# Patient Record
Sex: Female | Born: 1995 | Race: Black or African American | Hispanic: No | Marital: Single | Smoking: Never smoker
Health system: Southern US, Community
[De-identification: ages and names within clinical notes are randomized; demographics above are authoritative.]

## PROBLEM LIST (undated history)

## (undated) DIAGNOSIS — I471 Supraventricular tachycardia: Secondary | ICD-10-CM

## (undated) DIAGNOSIS — I10 Essential (primary) hypertension: Secondary | ICD-10-CM

## (undated) HISTORY — DX: Supraventricular tachycardia: I47.1

## (undated) HISTORY — PX: NO PAST SURGERIES: SHX2092

---

## 2019-08-21 ENCOUNTER — Other Ambulatory Visit: Payer: Self-pay

## 2019-08-21 ENCOUNTER — Emergency Department (HOSPITAL_COMMUNITY): Payer: 59

## 2019-08-21 ENCOUNTER — Ambulatory Visit (HOSPITAL_COMMUNITY)
Admission: EM | Admit: 2019-08-21 | Discharge: 2019-08-21 | Disposition: A | Discharge status: Home / Self Care | Payer: 59

## 2019-08-21 ENCOUNTER — Emergency Department (HOSPITAL_COMMUNITY)
Admission: EM | Admit: 2019-08-21 | Discharge: 2019-08-21 | Disposition: A | Discharge status: Home / Self Care | Payer: 59 | Attending: Emergency Medicine | Admitting: Emergency Medicine

## 2019-08-21 ENCOUNTER — Encounter (HOSPITAL_COMMUNITY): Payer: Self-pay | Admitting: Emergency Medicine

## 2019-08-21 ENCOUNTER — Encounter (HOSPITAL_COMMUNITY): Payer: Self-pay

## 2019-08-21 DIAGNOSIS — R0789 Other chest pain: Secondary | ICD-10-CM | POA: Insufficient documentation

## 2019-08-21 DIAGNOSIS — I10 Essential (primary) hypertension: Secondary | ICD-10-CM | POA: Diagnosis not present

## 2019-08-21 DIAGNOSIS — Z793 Long term (current) use of hormonal contraceptives: Secondary | ICD-10-CM | POA: Diagnosis not present

## 2019-08-21 DIAGNOSIS — R002 Palpitations: Secondary | ICD-10-CM | POA: Diagnosis not present

## 2019-08-21 DIAGNOSIS — R000 Tachycardia, unspecified: Secondary | ICD-10-CM | POA: Insufficient documentation

## 2019-08-21 DIAGNOSIS — Z789 Other specified health status: Secondary | ICD-10-CM

## 2019-08-21 HISTORY — DX: Essential (primary) hypertension: I10

## 2019-08-21 LAB — BASIC METABOLIC PANEL
Anion gap: 14 (ref 5–15)
BUN: 7 mg/dL (ref 6–20)
CO2: 21 mmol/L — ABNORMAL LOW (ref 22–32)
Calcium: 9.6 mg/dL (ref 8.9–10.3)
Chloride: 101 mmol/L (ref 98–111)
Creatinine, Ser: 0.85 mg/dL (ref 0.44–1.00)
GFR calc Af Amer: >60 mL/min (ref >60)
GFR calc non Af Amer: >60 mL/min (ref >60)
Glucose, Bld: 88 mg/dL (ref 70–99)
Potassium: 3.7 mmol/L (ref 3.5–5.1)
Sodium: 136 mmol/L (ref 135–145)

## 2019-08-21 LAB — URINALYSIS, ROUTINE W REFLEX MICROSCOPIC
Bilirubin Urine: NEGATIVE
Glucose, UA: NEGATIVE mg/dL
Hgb urine dipstick: NEGATIVE
Ketones, ur: 5 mg/dL — AB
Leukocytes,Ua: NEGATIVE
Nitrite: NEGATIVE
Protein, ur: NEGATIVE mg/dL
Specific Gravity, Urine: 1.016 (ref 1.005–1.030)
pH: 6 (ref 5.0–8.0)

## 2019-08-21 LAB — RAPID URINE DRUG SCREEN, HOSP PERFORMED
Amphetamines: NOT DETECTED
Barbiturates: NOT DETECTED
Benzodiazepines: NOT DETECTED
Cocaine: NOT DETECTED
Opiates: NOT DETECTED
Tetrahydrocannabinol: NOT DETECTED

## 2019-08-21 LAB — I-STAT BETA HCG BLOOD, ED (MC, WL, AP ONLY): I-stat hCG, quantitative: <5 m[IU]/mL (ref <5)

## 2019-08-21 LAB — CBC
HCT: 45 % (ref 36.0–46.0)
Hemoglobin: 15.1 g/dL — ABNORMAL HIGH (ref 12.0–15.0)
MCH: 32.9 pg (ref 26.0–34.0)
MCHC: 33.6 g/dL (ref 30.0–36.0)
MCV: 98 fL (ref 80.0–100.0)
Platelets: 248 10*3/uL (ref 150–400)
RBC: 4.59 MIL/uL (ref 3.87–5.11)
RDW: 13.4 % (ref 11.5–15.5)
WBC: 6.9 10*3/uL (ref 4.0–10.5)
nRBC: 0 % (ref 0.0–0.2)

## 2019-08-21 LAB — TROPONIN I (HIGH SENSITIVITY)
Troponin I (High Sensitivity): 5 ng/L (ref <18)
Troponin I (High Sensitivity): 6 ng/L (ref <18)

## 2019-08-21 LAB — D-DIMER, QUANTITATIVE: D-Dimer, Quant: <0.27 ug/mL-FEU (ref 0.00–0.50)

## 2019-08-21 LAB — TSH: TSH: 2.013 u[IU]/mL (ref 0.350–4.500)

## 2019-08-21 MED ORDER — METOPROLOL TARTRATE 5 MG/5ML IV SOLN
5.0000 mg | Freq: Once | INTRAVENOUS | Status: AC
  Administered 2019-08-21: 5 mg via INTRAVENOUS
  Filled 2019-08-21: qty 5

## 2019-08-21 MED ORDER — ADENOSINE 6 MG/2ML IV SOLN
12.0000 mg | Freq: Once | INTRAVENOUS | Status: AC
  Administered 2019-08-21: 16:00:00 12 mg via INTRAVENOUS

## 2019-08-21 MED ORDER — LORAZEPAM 2 MG/ML IJ SOLN
0.5000 mg | Freq: Once | INTRAMUSCULAR | Status: AC
  Administered 2019-08-21: 13:00:00 0.5 mg via INTRAVENOUS
  Filled 2019-08-21: qty 1

## 2019-08-21 MED ORDER — SODIUM CHLORIDE 0.9 % IV BOLUS
1000.0000 mL | Freq: Once | INTRAVENOUS | Status: AC
  Administered 2019-08-21: 1000 mL via INTRAVENOUS

## 2019-08-21 MED ORDER — ADENOSINE 6 MG/2ML IV SOLN
6.0000 mg | Freq: Once | INTRAVENOUS | Status: AC
  Administered 2019-08-21: 6 mg via INTRAVENOUS
  Filled 2019-08-21: qty 2

## 2019-08-21 MED ORDER — METOPROLOL TARTRATE 25 MG PO TABS: 25.0000 mg | ORAL_TABLET | Freq: Two times a day (BID) | ORAL | 0 refills | Status: DC

## 2019-08-21 MED ORDER — SODIUM CHLORIDE 0.9% FLUSH: 3.0000 mL | Freq: Once | INTRAVENOUS | Status: DC

## 2019-08-21 NOTE — Discharge Instructions (Signed)
Please go to the emergency department for further evaluation and management of your heart rate and blood pressure.

## 2019-08-21 NOTE — ED Notes (Signed)
Patient is being discharged from the [facility] and sent to the Emergency Department via wheelchair by staff. Per Malachy Moan, NP, patient is stable but in need of higher level of care due to tachycardia, chest pain, and sob. EKG obtained during visit, sinus tach. Patient is aware and verbalizes understanding of plan of care.  Vitals:   08/21/19 0828  BP: (!) 176/109  Pulse: (!) 146  Resp: 16  Temp: 98.7 F (37.1 C)  SpO2: 100%

## 2019-08-21 NOTE — ED Notes (Signed)
Patient verbalizes understanding of discharge instructions. Opportunity for questioning and answers were provided. Armband removed by staff, pt discharged from ED.  

## 2019-08-21 NOTE — ED Triage Notes (Addendum)
Pt reports feeling like her heart is pounding and initially having some chest tightness that began about 1 week ago. States that chest is no longer tight but that she had bilateral arm tingling today, went to UC and was sent here for further evaluation, r/o PE. Pt a/ox4, resp e/u, nad. Denies sob.

## 2019-08-21 NOTE — Discharge Instructions (Signed)
Start metoprolol once daily for blood pressure control and to help with high heart rate Make an appointment with Cardiology to follow up Please return if you are worsening

## 2019-08-21 NOTE — ED Triage Notes (Signed)
Pt states having chest dull chest pain x 1 day. Pt reports having headache and pounding heart rate x 1 week. Pt reports she woke up this morning with tingling sensation in her neck and irradiated to her arms.

## 2019-08-21 NOTE — ED Provider Notes (Signed)
[facility] EMERGENCY DEPARTMENT Provider Note   CSN: [id number] Arrival date & time: 08/21/19  0908     History   Chief Complaint Chief Complaint  Patient presents with  . Chest Pain  . Palpitations    HPI Leslie Tucker is a 23 y.o. female who presents with palpitations. She states that for the past weeks she has had "episodes" of where she will feel like her heart is racing, she will have a headache, sweats, and her chest will feel tight. It will usually last about an hour and then go away. Today she woke up and had another episode and then felt like her arms were tingling. This prompted her to go to UC. Her HR was found to be in the 130s. She was then referred to the ED for unexplained tachycardia. She reports associated headache and ongoing palpitations now. She reports frequent alcohol use - about 3 times a week she will have 3-4 glasses of wine. She also has been taking some generic OTC energy pills - her last dose of this was on Tuesday but otherwise she denies any prescription medication or other OTC meds. No recent surgery/travel/immobilization, hx of cancer, leg swelling, hemoptysis, prior DVT/PE. She is on OCPs.     HPI  Past Medical History:  Diagnosis Date  . Hypertension     There are no active problems to display for this patient.   History reviewed. No pertinent surgical history.   OB History    Gravida  1   Para  0   Term      Preterm      AB  1   Living  0     SAB      TAB      Ectopic      Multiple      Live Births               Home Medications    Prior to Admission medications   Medication Sig Start Date End Date Taking? Authorizing Provider  Levonorgestrel-Ethinyl Estradiol (AMETHIA) 0.15-0.03 &0.01 MG tablet Take 1 tablet by mouth daily. 08/03/19   [provider]    Family History No family history on file.  Social History Social History   Tobacco Use  . Smoking status: Never Smoker   . Smokeless tobacco: Never Used  Substance Use Topics  . Alcohol use: Yes  . Drug use: Never     Allergies   Patient has no known allergies.   Review of Systems Review of Systems  Constitutional: Negative for fever.  Respiratory: Positive for chest tightness. Negative for shortness of breath.   Cardiovascular: Positive for palpitations. Negative for chest pain and leg swelling.  Neurological: Positive for headaches. Negative for syncope and light-headedness.  All other systems reviewed and are negative.    Physical Exam Updated Vital Signs BP (!) 178/110 (BP Location: Right Arm)   Pulse (!) 130   Temp 98.3 F (36.8 C) (Oral)   Resp 16   SpO2 100%   Physical Exam Vitals signs and nursing note reviewed.  Constitutional:      General: She is not in acute distress.    Appearance: Normal appearance. She is well-developed. She is not ill-appearing.     Comments: Calm and cooperative. NAD  HENT:     Head: Normocephalic and atraumatic.  Eyes:     General: No scleral icterus.       Right eye: No discharge.  Left eye: No discharge.     Conjunctiva/sclera: Conjunctivae normal.     Pupils: Pupils are equal, round, and reactive to light.  Neck:     Musculoskeletal: Normal range of motion.  Cardiovascular:     Rate and Rhythm: Regular rhythm. Tachycardia present.  Pulmonary:     Effort: Pulmonary effort is normal. No respiratory distress.     Breath sounds: Normal breath sounds.  Abdominal:     General: There is no distension.     Palpations: Abdomen is soft.     Tenderness: There is no abdominal tenderness.  Musculoskeletal:     Right lower leg: No edema.     Left lower leg: No edema.  Skin:    General: Skin is warm and dry.  Neurological:     Mental Status: She is alert and oriented to person, place, and time.  Psychiatric:        Behavior: Behavior normal.      ED Treatments / Results  Labs (all labs ordered are listed, but only abnormal results are  displayed) Labs Reviewed  BASIC METABOLIC PANEL - Abnormal; Notable for the following components:      Result Value   CO2 21 (*)    All other components within normal limits  CBC - Abnormal; Notable for the following components:   Hemoglobin 15.1 (*)    All other components within normal limits  URINALYSIS, ROUTINE W REFLEX MICROSCOPIC - Abnormal; Notable for the following components:   Ketones, ur 5 (*)    All other components within normal limits  TSH  RAPID URINE DRUG SCREEN, HOSP PERFORMED  D-DIMER, QUANTITATIVE (NOT AT [facility])  I-STAT BETA HCG BLOOD, ED ([facility], [facility], [facility] ONLY)  TROPONIN I (HIGH SENSITIVITY)  TROPONIN I (HIGH SENSITIVITY)    EKG EKG Interpretation  Date/Time:  Friday August 21 2019 11:31:59 EDT Ventricular Rate:  137 PR Interval:    QRS Duration: 83 QT Interval:  287 QTC Calculation: 434 R Axis:   48 Text Interpretation:  Sinus tachycardia Low voltage, precordial leads Confirmed by Davonna Belling [phone]) on 08/21/2019 11:38:18 AM   Radiology Dg Chest 2 View  Result Date: 08/21/2019 CLINICAL DATA:  Dull chest pain, palpitations, headache EXAM: CHEST - 2 VIEW COMPARISON:  None. FINDINGS: The heart size and mediastinal contours are within normal limits. Both lungs are clear. The visualized skeletal structures are unremarkable. IMPRESSION: No active cardiopulmonary disease. Electronically Signed   By: Rolm Baptise M.D.   On: 08/21/2019 09:38    Procedures Procedures (including critical care time)  Medications Ordered in ED Medications  sodium chloride 0.9 % bolus 1,000 mL (0 mLs Intravenous Stopped 08/21/19 1455)  LORazepam (ATIVAN) injection 0.5 mg (0.5 mg Intravenous Given 08/21/19 1240)  adenosine (ADENOCARD) 6 MG/2ML injection 6 mg (6 mg Intravenous Given 08/21/19 1540)  adenosine (ADENOCARD) 6 MG/2ML injection 12 mg (12 mg Intravenous Given 08/21/19 1547)  metoprolol tartrate (LOPRESSOR) injection 5 mg (5 mg Intravenous Given 08/21/19 1556)      Initial Impression / Assessment and Plan / ED Course  I have reviewed the triage vital signs and the nursing notes.  Pertinent labs & imaging results that were available during my care of the patient were reviewed by me and considered in my medical decision making (see chart for details).  23 year old female presents with palpitations and unexplained tachycardia.  She has also significantly hypertensive.  She states that she has been told her blood pressure has been high before but has never been  diagnosed or treated for this before.  Unclear etiology at this time.  Could be related to anxiety, PE, hypovolemia/dehydration, SVT, hyperthyroidism, infection, drug use (although patient denies).  She also has significant alcohol use and does admit to drinking last night which could be related.  EKG is sinus tachycardia with rate in the 130s.  Will obtain blood work, chest x-ray, TSH, D-dimer  CBC is remarkable for mild elevation of hemoglobin.  BMP is remarkable for mildly low bicarb.  First and second troponin is 5 and 6 respectively.  She is not pregnant.  Her TSH within normal limits.  Her D-dimer is undetectable.  Her urine has 5 ketones so she may be very mildly dehydrated.  We will try fluids and Ativan.  2:41 PM Rechecked pt. She is still having HR in the 140s-150s. Will try adenosine.  6mg  Adenosine was attempted with no significant change and then 12mg  was given again without any change. Will try metoprolol.  After Metoprolol heart rate has decreased significantly to the high 90s.  Will start on a low-dose of this medicine and have her follow-up with cardiology.  Side effects of this medicine were discussed.  Patient is comfortable with plan.  She was given return precautions.  Final Clinical Impressions(s) / ED Diagnoses   Final diagnoses:  Tachycardia  Hypertension, unspecified type  Palpitations    ED Discharge Orders    None       Bethel BornGekas, Oyinkansola Truax Marie, PA-C 08/22/19 [id number]     Benjiman CorePickering, Nathan, MD 08/25/19 2242

## 2019-08-21 NOTE — ED Provider Notes (Signed)
[facility]    CSN: [id number] Arrival date & time: 08/21/19  3664      History   Chief Complaint Chief Complaint  Patient presents with  . Chest Pain  . Tingling  . Tachycardia  . Headache    HPI Leslie Tucker is a 23 y.o. female.   [facility] presents with complaints of "heart pounding" chest tightness, and headache. Has been ongoing for approximately 1 week now but much worse this morning with sensation of "numbness or tingling" to neck, arms and hands. Sometimes feels tingling down her legs. No shortness of breath. Pain pain with breathing or other described chest pain. States she can be laying down at rest and feel her heart rate going quickly. No cough. 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 She is on oral birth control. Doesn't smoke. She states she has chronic nausea, this is not new for her. No specific diaphoresis. Family history of hypertension, and states her grandfather recently had an ICD placed. States she has been told she is hypertensive for years with routine doctor's visits, typically in the 140's. Has never been on BP medications. Doesn't follow regularly with a PCP. No leg pain or swelling. No vision changes. No dizziness. No head injury. No confusion or slurred speech. Ambulatory without difficulty. Doesn't have regular periods due to her oral birth control, had spotting earlier this month.    ROS per HPI, negative if not otherwise mentioned.      Past Medical History:  Diagnosis Date  . Hypertension     There are no active problems to display for this patient.   History reviewed. No pertinent surgical history.  OB History    Gravida  1   Para  0   Term      Preterm      AB  1   Living  0     SAB      TAB      Ectopic      Multiple      Live Births               Home Medications    Prior to Admission  medications   Medication Sig Start Date End Date Taking? Authorizing Provider  Levonorgestrel-Ethinyl Estradiol (AMETHIA) 0.15-0.03 &0.01 MG tablet Take 1 tablet by mouth daily. 08/03/19   [provider]    Family History History reviewed. No pertinent family history.  Social History Social History   Tobacco Use  . Smoking status: Never Smoker  . Smokeless tobacco: Never Used  Substance Use Topics  . Alcohol use: Yes  . Drug use: Never     Allergies   Patient has no known allergies.   Review of Systems Review of Systems   Physical Exam Triage Vital Signs ED Triage Vitals  Enc Vitals Group     BP 08/21/19 0828 (!) 176/109     Pulse Rate 08/21/19 0828 (!) 146     Resp 08/21/19 0828 16     Temp 08/21/19 0828 98.7 F (37.1 C)     Temp src --      SpO2 08/21/19 0828 100 %     Weight --      Height --      Head Circumference --      Peak Flow --      Pain Score 08/21/19 0822 3  Pain Loc --      Pain Edu? --      Excl. in GC? --    No data found.  Updated Vital Signs BP (!) 176/109 (BP Location: Right Arm)   Pulse (!) 146   Temp 98.7 F (37.1 C)   Resp 16   SpO2 100%    Physical Exam Constitutional:      General: She is not in acute distress.    Appearance: She is well-developed.  Cardiovascular:     Rate and Rhythm: Regular rhythm. Tachycardia present.     Comments: Negative homans bilaterally; calves are soft and non tender  Pulmonary:     Effort: Pulmonary effort is normal.     Breath sounds: Normal breath sounds.  Musculoskeletal:     Right lower leg: No edema.     Left lower leg: No edema.  Skin:    General: Skin is warm and dry.  Neurological:     Mental Status: She is alert and oriented to person, place, and time.     Comments: strength equal bilaterally; gross sensation intact to upper extremities; moving extremities without difficulty; subjective tingling sensation     EKG:  Sinus tach hr 137. Previous EKG was not available  for review. No stwave changes as interpreted by me.    UC Treatments / Results  Labs (all labs ordered are listed, but only abnormal results are displayed) Labs Reviewed - No data to display  EKG   Radiology No results found.  Procedures Procedures (including critical care time)  Medications Ordered in UC Medications - No data to display  Initial Impression / Assessment and Plan / UC Course  I have reviewed the triage vital signs and the nursing notes.  Pertinent labs & imaging results that were available during my care of the patient were reviewed by me and considered in my medical decision making (see chart for details).     Patient with tachycardia and htn without specific cause for tachycardia. She is on oral birth control. I do feel that PE is part of differential at this time and therefore recommend further evaluation in the ER. Patient safe for self transport and agreeable to plan.  Final Clinical Impressions(s) / UC Diagnoses   Final diagnoses:  Tachycardia  Hypertension, unspecified type  Uses birth control     Discharge Instructions     Please go to the emergency department for further evaluation and management of your heart rate and blood pressure.    ED Prescriptions    None     PDMP not reviewed this encounter.   Linus Mako B, NP 08/21/19 0900

## 2019-09-02 ENCOUNTER — Ambulatory Visit: Payer: Self-pay | Admitting: Cardiology

## 2019-11-27 ENCOUNTER — Other Ambulatory Visit: Payer: Self-pay

## 2019-11-27 ENCOUNTER — Encounter: Payer: Self-pay | Admitting: Medical-Surgical

## 2019-11-27 ENCOUNTER — Ambulatory Visit: Payer: 59 | Admitting: Medical-Surgical

## 2019-11-27 VITALS — BP 154/102 | HR 102 | Temp 98.2°F | Ht 60.5 in | Wt 145.6 lb

## 2019-11-27 DIAGNOSIS — I471 Supraventricular tachycardia: Secondary | ICD-10-CM

## 2019-11-27 DIAGNOSIS — Z7689 Persons encountering health services in other specified circumstances: Secondary | ICD-10-CM | POA: Diagnosis not present

## 2019-11-27 DIAGNOSIS — R300 Dysuria: Secondary | ICD-10-CM

## 2019-11-27 DIAGNOSIS — I10 Essential (primary) hypertension: Secondary | ICD-10-CM

## 2019-11-27 LAB — POCT URINALYSIS DIP (CLINITEK)
Bilirubin, UA: NEGATIVE
Glucose, UA: NEGATIVE mg/dL
Ketones, POC UA: NEGATIVE mg/dL
Leukocytes, UA: NEGATIVE
Nitrite, UA: NEGATIVE
POC PROTEIN,UA: NEGATIVE
Spec Grav, UA: 1.01 (ref 1.010–1.025)
Urobilinogen, UA: 1 E.U./dL
pH, UA: 6.5 (ref 5.0–8.0)

## 2019-11-27 MED ORDER — METOPROLOL TARTRATE 25 MG PO TABS: 25.0000 mg | ORAL_TABLET | Freq: Two times a day (BID) | ORAL | 0 refills | Status: DC

## 2019-11-27 MED ORDER — NITROFURANTOIN MONOHYD MACRO 100 MG PO CAPS: 100.0000 mg | ORAL_CAPSULE | Freq: Two times a day (BID) | ORAL | 0 refills | Status: AC

## 2019-11-27 NOTE — Progress Notes (Signed)
New Patient Office Visit  Subjective:  Patient ID: Leslie Tucker, female    DOB: Apr 07, 1996  Age: 24 y.o. MRN: [id number]  CC:  Chief Complaint  Patient presents with  . Establish Care  . Hypertension  . Urinary Tract Infection    HPI Leslie Tucker is a pleasant 24 year old female presenting today to establish care.  Dysuria- Burning and pain with urination, occasional blood noted. No frequency, urgency, fever, chills, low back pain.  Admits less than optimal hydration.  Sexually active with one female partner without condom use.     Elevated heart rate- Previously taking Metoprolol 25mg  BID but has did not have a PCP and was unable to get refills on her prescriptions. Having episodes of increased heart rate with last episode lasting about 5 and half hours yesterday.  Some light headedness, diaphoresis, and numbness/tingling of extremities when heart rate elevated. No shortness of breath, nausea/vomiting, chest pain.  HTN- previously managed by metoprolol twice daily.  Does not check blood pressures at home.  Depression/Anxiety: Has seen psychiatry who recommended an antidepressant.  Holding off on prescribing until heart rate and blood pressure are back under control. No SI/HI.     Past Medical History:  Diagnosis Date  . Hypertension   . SVT (supraventricular tachycardia) (HCC)     Past Surgical History:  Procedure Laterality Date  . NO PAST SURGERIES      Family History  Problem Relation Age of Onset  . Depression Mother   . ADD / ADHD Sister   . ADD / ADHD Brother     Social History   Socioeconomic History  . Marital status: Single    Spouse name: Not on file  . Number of children: Not on file  . Years of education: Not on file  . Highest education level: Not on file  Occupational History  . Occupation: : [employer]  Tobacco Use  . Smoking status: Never Smoker  . Smokeless tobacco: Never Used  Substance and Sexual Activity  . Alcohol  use: Yes    Comment: 4 drinks/week  . Drug use: Never  . Sexual activity: Yes    Partners: Male  Other Topics Concern  . Not on file  Social History Narrative  . Not on file   Social Determinants of Health   Financial Resource Strain:   . Difficulty of Paying Living Expenses: Not on file  Food Insecurity:   . Worried About Teacher, adult education in the Last Year: Not on file  . Ran Out of Food in the Last Year: Not on file  Transportation Needs:   . Lack of Transportation (Medical): Not on file  . Lack of Transportation (Non-Medical): Not on file  Physical Activity:   . Days of Exercise per Week: Not on file  . Minutes of Exercise per Session: Not on file  Stress:   . Feeling of Stress : Not on file  Social Connections:   . Frequency of Communication with Friends and Family: Not on file  . Frequency of Social Gatherings with Friends and Family: Not on file  . Attends Religious Services: Not on file  . Active Member of Clubs or Organizations: Not on file  . Attends Programme researcher, broadcasting/film/video Meetings: Not on file  . Marital Status: Not on file  Intimate Partner Violence:   . Fear of Current or Ex-Partner: Not on file  . Emotionally Abused: Not on file  . Physically Abused: Not on file  .  Sexually Abused: Not on file    ROS Review of Systems  Constitutional: Negative for chills, fatigue, fever and unexpected weight change.  HENT: Negative for congestion, rhinorrhea and sore throat.   Eyes: Negative for visual disturbance.  Respiratory: Negative for cough, chest tightness and shortness of breath.   Cardiovascular: Positive for palpitations. Negative for chest pain and leg swelling.  Gastrointestinal: Negative for abdominal pain, constipation, diarrhea, nausea and vomiting.  Endocrine: Positive for cold intolerance. Negative for heat intolerance.  Genitourinary: Positive for dysuria and hematuria. Negative for flank pain, frequency, urgency and vaginal discharge.  Neurological:  Positive for light-headedness (With elevated heart rate) and numbness (With elevated heart rate). Negative for dizziness and headaches.  Psychiatric/Behavioral: Positive for sleep disturbance (Works night shift, difficulty sleeping during the day). Negative for self-injury and suicidal ideas.    Objective:   Today's Vitals: BP (!) 154/102   Pulse (!) 102   Temp 98.2 F (36.8 C) (Oral)   Ht 5' 0.5" (1.537 m)   Wt 145 lb 9.6 oz (66 kg)   LMP 11/13/2019   SpO2 100%   Breastfeeding Unknown   BMI 27.97 kg/m   Physical Exam Vitals reviewed.  Constitutional:      General: She is not in acute distress.    Appearance: Normal appearance.  HENT:     Head: Normocephalic and atraumatic.  Cardiovascular:     Rate and Rhythm: Normal rate and regular rhythm.     Pulses: Normal pulses.     Heart sounds: Normal heart sounds. No murmur. No friction rub. No gallop.   Pulmonary:     Effort: Pulmonary effort is normal. No respiratory distress.     Breath sounds: Normal breath sounds.  Abdominal:     General: Abdomen is flat. Bowel sounds are normal. There is no distension.     Palpations: Abdomen is soft.     Tenderness: There is no abdominal tenderness. There is no right CVA tenderness, left CVA tenderness, guarding or rebound.  Skin:    General: Skin is warm and dry.  Neurological:     Mental Status: She is alert and oriented to person, place, and time.  Psychiatric:        Mood and Affect: Mood normal.        Behavior: Behavior normal.        Thought Content: Thought content normal.        Judgment: Judgment normal.     Assessment & Plan:   Problem List Items Addressed This Visit      Cardiovascular and Mediastinum   Essential hypertension    Restart metoprolol 25 mg twice daily.  Monitor blood pressure and keep a record to bring to next appointment.      Relevant Medications   metoprolol tartrate (LOPRESSOR) 25 MG tablet   SVT (supraventricular tachycardia) (HCC)     Restarting metoprolol 25 mg twice daily.  Seek emergency care for prolonged elevated heart rate or if chest pain, shortness of breath, diaphoresis, nausea/vomiting develops.      Relevant Medications   metoprolol tartrate (LOPRESSOR) 25 MG tablet     Other   Dysuria    POCT UA negative for leukocytes, nitrites, positive trace blood.  Sending urine for gonorrhea/chlamydia. Will treat for symptoms with Macrobid twice daily x5 days.        Relevant Medications   nitrofurantoin, macrocrystal-monohydrate, (MACROBID) 100 MG capsule   Other Relevant Orders   POCT URINALYSIS DIP (CLINITEK) (Completed)   C. trachomatis/N.  gonorrhoeae RNA    Other Visit Diagnoses    Encounter to establish care    -  Primary      Outpatient Encounter Medications as of 11/27/2019  Medication Sig  . metoprolol tartrate (LOPRESSOR) 25 MG tablet Take 1 tablet (25 mg total) by mouth 2 (two) times daily.  . nitrofurantoin, macrocrystal-monohydrate, (MACROBID) 100 MG capsule Take 1 capsule (100 mg total) by mouth 2 (two) times daily for 5 days.  . [DISCONTINUED] Levonorgestrel-Ethinyl Estradiol (AMETHIA) 0.15-0.03 &0.01 MG tablet Take 1 tablet by mouth daily.  . [DISCONTINUED] metoprolol tartrate (LOPRESSOR) 25 MG tablet Take 1 tablet (25 mg total) by mouth 2 (two) times daily. (Patient not taking: Reported on 11/27/2019)   No facility-administered encounter medications on file as of 11/27/2019.    Follow-up: Return in about 4 weeks (around 12/25/2019) for HTN follow up.   Samuel Bouche, NP

## 2019-11-27 NOTE — Assessment & Plan Note (Signed)
Restarting metoprolol 25 mg twice daily.  Seek emergency care for prolonged elevated heart rate or if chest pain, shortness of breath, diaphoresis, nausea/vomiting develops.

## 2019-11-27 NOTE — Assessment & Plan Note (Signed)
POCT UA negative for leukocytes, nitrites, positive trace blood.  Sending urine for gonorrhea/chlamydia. Will treat for symptoms with Macrobid twice daily x5 days.

## 2019-11-27 NOTE — Assessment & Plan Note (Signed)
Restart metoprolol 25 mg twice daily.  Monitor blood pressure and keep a record to bring to next appointment.

## 2019-11-28 LAB — C. TRACHOMATIS/N. GONORRHOEAE RNA
C. trachomatis RNA, TMA: NOT DETECTED
N. gonorrhoeae RNA, TMA: NOT DETECTED

## 2019-12-19 ENCOUNTER — Other Ambulatory Visit: Payer: Self-pay | Admitting: Medical-Surgical

## 2019-12-19 DIAGNOSIS — I10 Essential (primary) hypertension: Secondary | ICD-10-CM

## 2019-12-25 ENCOUNTER — Ambulatory Visit: Payer: 59 | Admitting: Medical-Surgical

## 2019-12-28 ENCOUNTER — Ambulatory Visit (INDEPENDENT_AMBULATORY_CARE_PROVIDER_SITE_OTHER): Payer: 59 | Admitting: Medical-Surgical

## 2019-12-28 ENCOUNTER — Other Ambulatory Visit: Payer: Self-pay

## 2019-12-28 ENCOUNTER — Encounter: Payer: Self-pay | Admitting: Medical-Surgical

## 2019-12-28 VITALS — BP 149/113 | HR 90 | Temp 98.8°F | Ht 60.5 in | Wt 146.0 lb

## 2019-12-28 DIAGNOSIS — F418 Other specified anxiety disorders: Secondary | ICD-10-CM | POA: Insufficient documentation

## 2019-12-28 DIAGNOSIS — I10 Essential (primary) hypertension: Secondary | ICD-10-CM | POA: Diagnosis not present

## 2019-12-28 DIAGNOSIS — I471 Supraventricular tachycardia: Secondary | ICD-10-CM | POA: Diagnosis not present

## 2019-12-28 MED ORDER — ESCITALOPRAM OXALATE 10 MG PO TABS: 10.0000 mg | ORAL_TABLET | Freq: Every day | ORAL | 0 refills | Status: DC

## 2019-12-28 MED ORDER — METOPROLOL SUCCINATE ER 50 MG PO TB24: 50.0000 mg | ORAL_TABLET | Freq: Every day | ORAL | 3 refills | Status: DC

## 2019-12-28 NOTE — Assessment & Plan Note (Signed)
Starting Lexapro 10 mg daily.

## 2019-12-28 NOTE — Assessment & Plan Note (Signed)
Switching metoprolol 25 mg twice daily to Toprol XL 50 mg daily.  Encouraged to obtain home blood pressure monitor and check blood pressures at home.  I will touch base with her via MyChart in 2 weeks to evaluate need for additional antihypertensives.

## 2019-12-28 NOTE — Progress Notes (Signed)
Subjective:    CC: HTN follow up  HPI: Pleasant 24 year old female presenting for HTN follow up after restarting Metoprolol 25mg  BID 4 weeks ago.  Has been taking medication as prescribed with no missed doses.  No side effects, tolerating well.  Reports only one episode of palpitations occurring yesterday, lasting several hours, accompanied by mild headache and diaphoresis.  Not checking blood pressures at home, has not purchased a monitor yet.  Eating a low-sodium diet and exercising regularly.  Denies chest pain, shortness of breath, lower extremity swelling, and dizziness.  Reports she started taking COC's from an old prescription 1 week ago and wonders if this may have something to do with her blood pressure still being elevated.  Continues to have significant anxiety with mild depression.  At last appointment, we discussed starting some medication but wanted to get her heart rate and blood pressure under control first.  Would like to go ahead and get started on some anxiety/depression medication today.  Denies SI/HI.  I reviewed the past medical history, family history, social history, surgical history, and allergies today and no changes were needed.  Please see the problem list section below in epic for further details.  Past Medical History: Past Medical History:  Diagnosis Date  . Hypertension   . SVT (supraventricular tachycardia) (HCC)    Past Surgical History: Past Surgical History:  Procedure Laterality Date  . NO PAST SURGERIES     Social History: Social History   Socioeconomic History  . Marital status: Single    Spouse name: Not on file  . Number of children: Not on file  . Years of education: Not on file  . Highest education level: Not on file  Occupational History  . Occupation: Programmer, multimedia: [employer]  Tobacco Use  . Smoking status: Never Smoker  . Smokeless tobacco: Never Used  Substance and Sexual Activity  . Alcohol use: Yes    Comment: 4 drinks/week   . Drug use: Never  . Sexual activity: Yes    Partners: Male  Other Topics Concern  . Not on file  Social History Narrative  . Not on file   Social Determinants of Health   Financial Resource Strain:   . Difficulty of Paying Living Expenses: Not on file  Food Insecurity:   . Worried About Charity fundraiser in the Last Year: Not on file  . Ran Out of Food in the Last Year: Not on file  Transportation Needs:   . Lack of Transportation (Medical): Not on file  . Lack of Transportation (Non-Medical): Not on file  Physical Activity:   . Days of Exercise per Week: Not on file  . Minutes of Exercise per Session: Not on file  Stress:   . Feeling of Stress : Not on file  Social Connections:   . Frequency of Communication with Friends and Family: Not on file  . Frequency of Social Gatherings with Friends and Family: Not on file  . Attends Religious Services: Not on file  . Active Member of Clubs or Organizations: Not on file  . Attends Archivist Meetings: Not on file  . Marital Status: Not on file   Family History: Family History  Problem Relation Age of Onset  . Depression Mother   . ADD / ADHD Sister   . ADD / ADHD Brother    Allergies: No Known Allergies Medications: See med rec.  Review of Systems: No fevers, chills, night sweats, weight loss, chest  pain, or shortness of breath.   Objective:    General: Well Developed, well nourished, and in no acute distress.  Neuro: Alert and oriented x3.  HEENT: Normocephalic, atraumatic.  Skin: Warm and dry. Cardiac: Regular rate and rhythm, no murmurs rubs or gallops, no lower extremity edema.  Respiratory: Clear to auscultation bilaterally. Not using accessory muscles, speaking in full sentences.   Impression and Recommendations:    Essential hypertension Switching metoprolol 25 mg twice daily to Toprol XL 50 mg daily.  Encouraged to obtain home blood pressure monitor and check blood pressures at home.  I will  touch base with her via MyChart in 2 weeks to evaluate need for additional antihypertensives.  Depression with anxiety Starting Lexapro 10 mg daily.  SVT (supraventricular tachycardia) (HCC) Stable, controlled with metoprolol.  Will switch to once a day dosing for convenience.  Start Toprol XL 50 mg daily.  Seek emergency care for prolonged elevated heart rate, especially if accompanied by diaphoresis, shortness of breath, chest pain, lightheadedness/dizziness.  Follow-up: Return in 4 to 6 weeks for depression/anxiety follow-up.  May need nurse visit for blood pressure check in 2 weeks if she has not obtained a blood pressure monitor for home use. ___________________________________________ Thayer Ohm, DNP, APRN, FNP-BC Primary Care and Sports Medicine [facility]

## 2019-12-28 NOTE — Assessment & Plan Note (Signed)
Stable, controlled with metoprolol.  Will switch to once a day dosing for convenience.  Start Toprol XL 50 mg daily.  Seek emergency care for prolonged elevated heart rate, especially if accompanied by diaphoresis, shortness of breath, chest pain, lightheadedness/dizziness.

## 2019-12-29 ENCOUNTER — Other Ambulatory Visit (INDEPENDENT_AMBULATORY_CARE_PROVIDER_SITE_OTHER): Payer: 59 | Admitting: Medical-Surgical

## 2019-12-29 ENCOUNTER — Telehealth: Payer: Self-pay

## 2019-12-29 ENCOUNTER — Encounter: Payer: Self-pay | Admitting: Medical-Surgical

## 2019-12-29 DIAGNOSIS — F418 Other specified anxiety disorders: Secondary | ICD-10-CM

## 2019-12-29 MED ORDER — BUPROPION HCL ER (XL) 150 MG PO TB24: 150.0000 mg | ORAL_TABLET | Freq: Every day | ORAL | 1 refills | Status: DC

## 2019-12-29 NOTE — Telephone Encounter (Addendum)
Pt aware that the Rx has been sent to the pharmacy and to look for [facility] to contact her for scheduling. Pt is already scheduled for a f/up on 01/25/2020 with Christen Butter, DNP, APRN, FNP-BC. No further questions or concerns at this time

## 2019-12-29 NOTE — Progress Notes (Signed)
Patient call received regarding allergic reaction to Lexapro started 12/28/2019. After 1 dose, patient started experiencing increased heart rate, chest discomfort, mild SHOB, heavy sweating, and auditory hallucinations. Symptoms improved overnight but not resolved completely. Discontinue lexapro, medication added to allergy list.   To address depression with anxiety, there are a couple of options.  We can start Wellbutrin 150mg  daily. We can also refer for behavioral health to establish a counseling relationship. Will reach out to the patient to evaluate preferences.

## 2019-12-29 NOTE — Telephone Encounter (Signed)
Pt aware of recommendations and she would like to try both the new Rx as well as getting the referral to [facility].

## 2019-12-29 NOTE — Telephone Encounter (Signed)
Wellbutrin 150mg  daily sent to pharmacy. Referral to Behavioral Therapy entered. They will call from [place] ) to get this scheduled. Please follow up in 4-6 weeks for evaluation of depression/anxiety with Wellbutrin.

## 2019-12-29 NOTE — Telephone Encounter (Signed)
Definitely do not take any more Lexapro.  Medication has been discontinued and added to the allergy list.  We have some options to help manage depression and anxiety.  We can start a new medication called Wellbutrin 150 mg daily, we can initiate a referral to behavioral health to establish a counseling relationship, or we can do both.

## 2019-12-29 NOTE — Telephone Encounter (Signed)
Pt took first dose of escitalopram ast night and then around midnight last night she started having shivers, uncontrollable muscle twitches, heavy sweating, increased heart rate, chest discomfort, mild ShOB, and auditory hallucinations. She is still shivering this morning but all other sx have greatly improved. I instructed pt not to take anymore of the med until she heard back from Christen Butter, DNP, APRN, FNP-BC

## 2020-01-07 ENCOUNTER — Telehealth: Payer: Self-pay

## 2020-01-07 NOTE — Telephone Encounter (Signed)
We typically do not see these symptoms after one dose of medication, so this is unusual. These symptoms are very commonly associated with discontinuation of this type of medication when it is stopped abruptly after taking for a while. In these instances, the symptoms can persist for a month and sometimes longer. I suggest giving it a little more time to see if the symptoms resolve on their own.

## 2020-01-07 NOTE — Telephone Encounter (Signed)
Pt started escitalopram on 12/28/2018 and had SE's (see 12/29/2019 telephone note). She was switched to bupropion and started taking that on 12/29/2019. Pt states most of the sx have resolved but she is still having some paresthesia and muscle twitches. She denies any new sx. Pt would like to know how long these sx will be present.

## 2020-01-07 NOTE — Telephone Encounter (Signed)
Patient aware of response and recommendations. Advised if symptoms persisted to call and schedule an OV. Patient verbalized understanding. No further questions or concerns at this time.

## 2020-01-11 ENCOUNTER — Encounter: Payer: Self-pay | Admitting: Medical-Surgical

## 2020-01-14 MED ORDER — AMLODIPINE BESYLATE 5 MG PO TABS: 5.0000 mg | ORAL_TABLET | Freq: Every day | ORAL | 3 refills | Status: DC

## 2020-01-14 NOTE — Addendum Note (Signed)
Addended byChristen Butter on: 01/14/2020 08:10 AM   Modules accepted: Orders

## 2020-01-20 ENCOUNTER — Other Ambulatory Visit: Payer: Self-pay | Admitting: Medical-Surgical

## 2020-01-20 DIAGNOSIS — F418 Other specified anxiety disorders: Secondary | ICD-10-CM

## 2020-01-25 ENCOUNTER — Telehealth (INDEPENDENT_AMBULATORY_CARE_PROVIDER_SITE_OTHER): Payer: 59 | Admitting: Medical-Surgical

## 2020-01-25 ENCOUNTER — Encounter: Payer: Self-pay | Admitting: Medical-Surgical

## 2020-01-25 VITALS — BP 130/91

## 2020-01-25 DIAGNOSIS — I10 Essential (primary) hypertension: Secondary | ICD-10-CM | POA: Diagnosis not present

## 2020-01-25 DIAGNOSIS — F418 Other specified anxiety disorders: Secondary | ICD-10-CM

## 2020-01-25 NOTE — Progress Notes (Signed)
Virtual Visit via Video Note  I connected with [facility] on 01/25/20 at  9:10 AM EDT by a video enabled telemedicine application and verified that I am speaking with the correct person using two identifiers.   I discussed the limitations of evaluation and management by telemedicine and the availability of in person appointments. The patient expressed understanding and agreed to proceed.  Subjective:    CC: Hypertension and depression follow-up  HPI: Pleasant 24 year old female presenting via MyChart video visit for follow-up on depression and hypertension.  Depression-taking Wellbutrin 150 mg daily. Tolerating this well without side effects.  Reports feeling that this is made a big difference and that she feels better.  Was able to schedule an appointment with counseling in April.  No SI/HI.  Hypertension-taking amlodipine 5 mg daily and double XL 50 mg daily.  Checking blood pressures daily with readings 130s/88-95.  Denies shortness of breath, chest pain, lower extremity edema, dizziness, and headaches.  Feels that metoprolol is controlling her palpitations well.  Past medical history, Surgical history, Family history not pertinant except as noted below, Social history, Allergies, and medications have been entered into the medical record, reviewed, and corrections made.   Review of Systems: No fevers, chills, night sweats, weight loss, chest pain, or shortness of breath.   Objective:    General: Speaking clearly in complete sentences without any shortness of breath.  Alert and oriented x3.  Normal judgment. No apparent acute distress.  Impression and Recommendations:    1. Depression with anxiety Continue Wellbutrin 150 mg daily.  Keep counseling appointment as scheduled.  2. Essential hypertension Continue Toprol-XL 50 mg daily and amlodipine 5 mg daily for blood pressure control.  Advised to continue limiting sodium in the diet and exercising regularly.   I discussed  the assessment and treatment plan with the patient. The patient was provided an opportunity to ask questions and all were answered. The patient agreed with the plan and demonstrated an understanding of the instructions.   The patient was advised to call back or seek an in-person evaluation if the symptoms worsen or if the condition fails to improve as anticipated.  Return in about 6 months (around 07/27/2020) for HTN, depression, and SVT.  15 minutes of non-face-to-face time was provided during this encounter.  Thayer Ohm, DNP, APRN, FNP-BC [facility] and Sports Medicine

## 2020-02-12 ENCOUNTER — Ambulatory Visit: Payer: 59 | Admitting: Psychology

## 2020-03-23 DIAGNOSIS — H5201 Hypermetropia, right eye: Secondary | ICD-10-CM | POA: Diagnosis not present

## 2020-03-23 DIAGNOSIS — H52223 Regular astigmatism, bilateral: Secondary | ICD-10-CM | POA: Diagnosis not present

## 2020-03-23 DIAGNOSIS — H5212 Myopia, left eye: Secondary | ICD-10-CM | POA: Diagnosis not present

## 2020-05-15 ENCOUNTER — Encounter (HOSPITAL_COMMUNITY): Payer: Self-pay | Admitting: Emergency Medicine

## 2020-05-15 ENCOUNTER — Ambulatory Visit
Admission: EM | Admit: 2020-05-15 | Discharge: 2020-05-15 | Disposition: A | Discharge status: Home / Self Care | Payer: 59 | Source: Home / Self Care

## 2020-05-15 ENCOUNTER — Other Ambulatory Visit: Payer: Self-pay

## 2020-05-15 ENCOUNTER — Emergency Department (HOSPITAL_COMMUNITY)
Admission: EM | Admit: 2020-05-15 | Discharge: 2020-05-15 | Disposition: A | Discharge status: Home / Self Care | Payer: 59 | Attending: Emergency Medicine | Admitting: Emergency Medicine

## 2020-05-15 ENCOUNTER — Encounter: Payer: Self-pay | Admitting: Emergency Medicine

## 2020-05-15 DIAGNOSIS — I10 Essential (primary) hypertension: Secondary | ICD-10-CM | POA: Insufficient documentation

## 2020-05-15 DIAGNOSIS — Z79899 Other long term (current) drug therapy: Secondary | ICD-10-CM | POA: Diagnosis not present

## 2020-05-15 DIAGNOSIS — R040 Epistaxis: Secondary | ICD-10-CM | POA: Diagnosis not present

## 2020-05-15 LAB — FIBRINOGEN: Fibrinogen: 251 mg/dL (ref 210–475)

## 2020-05-15 LAB — HEMOGLOBIN AND HEMATOCRIT, BLOOD
HCT: 37.2 % (ref 36.0–46.0)
Hemoglobin: 12.1 g/dL (ref 12.0–15.0)

## 2020-05-15 LAB — ABO/RH: ABO/RH(D): AB POS

## 2020-05-15 LAB — PROTIME-INR
INR: 1.2 (ref 0.8–1.2)
Prothrombin Time: 14.7 seconds (ref 11.4–15.2)

## 2020-05-15 LAB — TYPE AND SCREEN
ABO/RH(D): AB POS
Antibody Screen: NEGATIVE

## 2020-05-15 LAB — APTT: aPTT: 24 seconds (ref 24–36)

## 2020-05-15 MED ORDER — OXYMETAZOLINE HCL 0.05 % NA SOLN
1.0000 | Freq: Two times a day (BID) | NASAL | Status: DC
  Administered 2020-05-15: 1 via NASAL

## 2020-05-15 MED ORDER — OXYMETAZOLINE HCL 0.05 % NA SOLN
2.0000 | Freq: Once | NASAL | Status: AC
  Administered 2020-05-15: 2 via NASAL
  Filled 2020-05-15: qty 30

## 2020-05-15 MED ORDER — SODIUM CHLORIDE 0.9 % IV BOLUS
1000.0000 mL | Freq: Once | INTRAVENOUS | Status: AC
  Administered 2020-05-15: 1000 mL via INTRAVENOUS

## 2020-05-15 MED ORDER — LIDOCAINE-EPINEPHRINE 1 %-1:100000 IJ SOLN
10.0000 mL | Freq: Once | INTRAMUSCULAR | Status: DC
  Filled 2020-05-15: qty 1

## 2020-05-15 NOTE — ED Notes (Signed)
Patient is being discharged from the Urgent Care and sent to the Emergency Department via private vehicle. Per [place] Hall-Potvin, APP, patient is in need of higher level of care due to uncontrolled bleeding, unable to complete exam. Patient is aware and verbalizes understanding of plan of care.  Vitals:   05/15/20 1156  BP: 119/80  Pulse: (!) 102  Resp: 18  SpO2: 95%

## 2020-05-15 NOTE — ED Notes (Signed)
Pt resting comfortable at this time. Bleeding controlled.

## 2020-05-15 NOTE — ED Triage Notes (Signed)
Pt presents to [facility] for assessment of nose bleed starting approx 45 minutes ago, will not stop.

## 2020-05-15 NOTE — ED Triage Notes (Signed)
Pt. Stated, I was sitting on the couch and my nose started bleeding. I had them when I was younger .

## 2020-05-15 NOTE — ED Provider Notes (Signed)
[facility] EMERGENCY DEPARTMENT Provider Note   CSN: [id number] Arrival date & time: 05/15/20  1254     History Chief Complaint  Patient presents with  . Epistaxis    Leslie Tucker is a 24 y.o. female with PMH of HTN who presents to the ED from urgent care for epistaxis.  Patient reports that approximately 90 minutes prior to my examination, she was resting comfortably on her couch when she developed her atraumatic epistaxis.  She denies any recent congestion symptoms or rhinorrhea.  She takes medications for her high blood pressure, but no other medications or anticoagulation.  No known history of bleeding disorders.  She used to have epistaxis when she was younger, but never this severe or recurrent.  She works as an Charity fundraiser here at the hospital.  I was grabbed by an Charity fundraiser and EMT due to patient's profuse bleeding on arrival.  She is actively spitting up clots.  HPI     Past Medical History:  Diagnosis Date  . Hypertension   . SVT (supraventricular tachycardia) [facility])     Patient Active Problem List   Diagnosis Date Noted  . Depression with anxiety 12/28/2019  . Essential hypertension 11/27/2019  . Dysuria 11/27/2019  . SVT (supraventricular tachycardia) (HCC) 11/27/2019    Past Surgical History:  Procedure Laterality Date  . NO PAST SURGERIES       OB History    Gravida  2   Para  0   Term      Preterm      AB  1   Living  0     SAB      TAB      Ectopic      Multiple      Live Births              Family History  Problem Relation Age of Onset  . Depression Mother   . ADD / ADHD Sister   . ADD / ADHD Brother     Social History   Tobacco Use  . Smoking status: Never Smoker  . Smokeless tobacco: Never Used  Vaping Use  . Vaping Use: Never used  Substance Use Topics  . Alcohol use: Yes    Comment: 4 drinks/week  . Drug use: Never    Home Medications Prior to Admission medications   Medication Sig Start Date End  Date Taking? Authorizing Provider  amLODipine (NORVASC) 5 MG tablet Take 1 tablet (5 mg total) by mouth daily. 01/14/20   Christen Butter, NP  buPROPion (WELLBUTRIN XL) 150 MG 24 hr tablet TAKE 1 TABLET BY MOUTH EVERY DAY 01/20/20   Christen Butter, NP  metoprolol succinate (TOPROL-XL) 50 MG 24 hr tablet Take 1 tablet (50 mg total) by mouth daily. Take with or immediately following a meal. 12/28/19   Christen Butter, NP    Allergies    Lexapro [escitalopram oxalate]  Review of Systems   Review of Systems  Constitutional: Negative for fever.  HENT: Positive for nosebleeds.   Respiratory: Negative for shortness of breath.   Cardiovascular: Negative for chest pain.  Neurological: Negative for dizziness, weakness and light-headedness.    Physical Exam Updated Vital Signs BP 112/80 (BP Location: Right Arm)   Pulse 87   Temp 98.7 F (37.1 C) (Oral)   Resp 14   Ht 5\' 10"  (1.778 m)   Wt 63.5 kg   SpO2 100%   BMI 20.09 kg/m   Physical Exam Vitals and nursing note  reviewed. Exam conducted with a chaperone present.  Constitutional:      Appearance: Normal appearance.  HENT:     Head: Normocephalic and atraumatic.  Eyes:     General: No scleral icterus.    Conjunctiva/sclera: Conjunctivae normal.  Pulmonary:     Effort: Pulmonary effort is normal.  Skin:    General: Skin is dry.  Neurological:     Mental Status: She is alert.     GCS: GCS eye subscore is 4. GCS verbal subscore is 5. GCS motor subscore is 6.  Psychiatric:        Mood and Affect: Mood normal.        Behavior: Behavior normal.        Thought Content: Thought content normal.     ED Results / Procedures / Treatments   Labs (all labs ordered are listed, but only abnormal results are displayed) Labs Reviewed  HEMOGLOBIN AND HEMATOCRIT, BLOOD  FIBRINOGEN  PROTIME-INR  APTT  TYPE AND SCREEN  ABO/RH    EKG None  Radiology No results found.  Procedures Procedures (including critical care time)  Medications Ordered  in ED Medications  sodium chloride 0.9 % bolus 1,000 mL (0 mLs Intravenous Stopped 05/15/20 1545)  oxymetazoline (AFRIN) 0.05 % nasal spray 2 spray (2 sprays Each Nare Given 05/15/20 1447)    ED Course  I have reviewed the triage vital signs and the nursing notes.  Pertinent labs & imaging results that were available during my care of the patient were reviewed by me and considered in my medical decision making (see chart for details).    MDM Rules/Calculators/A&P                          On my initial examination, patient was normotensive with normal HR, however was bleeding profusely. She had denied any weakness or lightheadedness. I encouraged her to keep firm compression for 15 minutes before I could reevaluate to inspect her nares bilaterally. However, after approximately 10 minutes I had to return to the ED as I was informed that she became tachycardic and hypotensive to 70s systolic. On my subsequent evaluation, patient's bleeding had stopped, I suspect to to her hypotension. She was mildly diaphoretic and felt as though she would pass out. I suspect that she had vasovagal episode which precipitated her abrupt change in vital signs and symptoms.  However, obtained coag studies as well as type and screen. I also started patient on 1 L IV NS. Her bleeding resumed after her blood pressure once again became normotensive. After conduct exam, suspected anterior epistaxis in right nare and provided Rhino Rocket followed by Afrin administration. Patient tolerated procedure well.  On subsequent evaluation, patient is feeling much improved.  She has now been monitored in the ED for over 2 hours and there has been no leaking in the [employer].  She is hemodynamically stable with improved vital signs.  Patient to follow-up with ENT in 48 hours for removal of Rhino Rocket.   Final Clinical Impression(s) / ED Diagnoses Final diagnoses:  Epistaxis    Rx / DC Orders ED Discharge Orders    None         Lorelee New, PA-C 05/15/20 1621    Geoffery Lyons, MD 05/15/20 1925

## 2020-05-15 NOTE — ED Provider Notes (Signed)
[facility]    CSN: [id number] Arrival date & time: 05/15/20  1145      History   Chief Complaint Chief Complaint  Patient presents with  . Epistaxis    HPI Leslie Tucker is a 24 y.o. female with history of SVT, hypertension presenting for epistaxis x45 minutes PTA.  Denies inciting event, history thereof.  No headache, weakness, dizziness.  Denies runny nose, dry nose.  Has tried applying direct pressure without relief.  No anticoagulant or blood thinner use.   Past Medical History:  Diagnosis Date  . Hypertension   . SVT (supraventricular tachycardia) [facility])     Patient Active Problem List   Diagnosis Date Noted  . Depression with anxiety 12/28/2019  . Essential hypertension 11/27/2019  . Dysuria 11/27/2019  . SVT (supraventricular tachycardia) (HCC) 11/27/2019    Past Surgical History:  Procedure Laterality Date  . NO PAST SURGERIES      OB History    Gravida  2   Para  0   Term      Preterm      AB  1   Living  0     SAB      TAB      Ectopic      Multiple      Live Births               Home Medications    Prior to Admission medications   Medication Sig Start Date End Date Taking? Authorizing Provider  amLODipine (NORVASC) 5 MG tablet Take 1 tablet (5 mg total) by mouth daily. 01/14/20   Christen Butter, NP  buPROPion (WELLBUTRIN XL) 150 MG 24 hr tablet TAKE 1 TABLET BY MOUTH EVERY DAY 01/20/20   Christen Butter, NP  metoprolol succinate (TOPROL-XL) 50 MG 24 hr tablet Take 1 tablet (50 mg total) by mouth daily. Take with or immediately following a meal. 12/28/19   Christen Butter, NP    Family History Family History  Problem Relation Age of Onset  . Depression Mother   . ADD / ADHD Sister   . ADD / ADHD Brother     Social History Social History   Tobacco Use  . Smoking status: Never Smoker  . Smokeless tobacco: Never Used  Vaping Use  . Vaping Use: Never used  Substance Use Topics  . Alcohol use: Yes    Comment: 4  drinks/week  . Drug use: Never     Allergies   Lexapro [escitalopram oxalate]   Review of Systems As per HPI   Physical Exam Triage Vital Signs ED Triage Vitals  Enc Vitals Group     BP      Pulse      Resp      Temp      Temp src      SpO2      Weight      Height      Head Circumference      Peak Flow      Pain Score      Pain Loc      Pain Edu?      Excl. in GC?    No data found.  Updated Vital Signs BP 119/80 (BP Location: Left Arm)   Pulse (!) 102   Resp 18   SpO2 95%   Visual Acuity Right Eye Distance:   Left Eye Distance:   Bilateral Distance:    Right Eye Near:   Left Eye Near:  Bilateral Near:     Physical Exam Constitutional:      General: She is not in acute distress. HENT:     Head: Normocephalic and atraumatic.     Nose:     Comments: Nose actively bleeding from both nares.  Unable to identify wound.  Nose nontender.  Exam limited second to active bleeding. Eyes:     General: No scleral icterus.    Pupils: Pupils are equal, round, and reactive to light.  Cardiovascular:     Rate and Rhythm: Normal rate.  Pulmonary:     Effort: Pulmonary effort is normal.  Skin:    Coloration: Skin is not jaundiced or pale.  Neurological:     Mental Status: She is alert and oriented to person, place, and time.      UC Treatments / Results  Labs (all labs ordered are listed, but only abnormal results are displayed) Labs Reviewed - No data to display  EKG   Radiology No results found.  Procedures Procedures (including critical care time)  Medications Ordered in UC Medications  oxymetazoline (AFRIN) 0.05 % nasal spray 1 spray (1 spray Each Nare Given 05/15/20 1204)    Initial Impression / Assessment and Plan / UC Course  I have reviewed the triage vital signs and the nursing notes.  Pertinent labs & imaging results that were available during my care of the patient were reviewed by me and considered in my medical decision making (see  chart for details).     Patient with active nosebleed fracture to Afrin, direct pressure.  Recommend ER for further evaluation and management.  Patient electing to transport in personal vehicle in fair condition with friend.  Return precautions discussed, pt verbalized understanding and is agreeable to plan. Final Clinical Impressions(s) / UC Diagnoses   Final diagnoses:  Epistaxis   Discharge Instructions   None    ED Prescriptions    None     PDMP not reviewed this encounter.   Hall-Potvin, [place], [place] 05/15/20 1209

## 2020-05-15 NOTE — Discharge Instructions (Addendum)
Please call [facility] ENT to schedule appointment for removal of your Rhino Rocket packing in 48 hours.  You will need to follow-up with your primary care provider regarding today's encounter.  Please take it easy today and drink plenty of fluids.  Return to the ED or seek immediate medical attention should you experience any new or worsening symptoms.

## 2020-06-12 ENCOUNTER — Ambulatory Visit
Admission: EM | Admit: 2020-06-12 | Discharge: 2020-06-12 | Disposition: A | Discharge status: Home / Self Care | Payer: 59 | Attending: Emergency Medicine | Admitting: Emergency Medicine

## 2020-06-12 ENCOUNTER — Encounter: Payer: Self-pay | Admitting: Emergency Medicine

## 2020-06-12 ENCOUNTER — Other Ambulatory Visit: Payer: Self-pay

## 2020-06-12 ENCOUNTER — Telehealth: Payer: Self-pay | Admitting: Emergency Medicine

## 2020-06-12 DIAGNOSIS — N3001 Acute cystitis with hematuria: Secondary | ICD-10-CM | POA: Insufficient documentation

## 2020-06-12 LAB — POCT URINALYSIS DIP (MANUAL ENTRY)
Bilirubin, UA: NEGATIVE
Glucose, UA: NEGATIVE mg/dL
Ketones, POC UA: NEGATIVE mg/dL
Nitrite, UA: NEGATIVE
Protein Ur, POC: NEGATIVE mg/dL
Spec Grav, UA: 1.02 (ref 1.010–1.025)
Urobilinogen, UA: 0.2 E.U./dL
pH, UA: 5.5 (ref 5.0–8.0)

## 2020-06-12 LAB — POCT URINE PREGNANCY: Preg Test, Ur: NEGATIVE

## 2020-06-12 MED ORDER — CEPHALEXIN 500 MG PO CAPS
500.0000 mg | ORAL_CAPSULE | Freq: Two times a day (BID) | ORAL | 0 refills | Status: AC
Start: 2020-06-12 — End: 2020-06-15

## 2020-06-12 NOTE — Discharge Instructions (Addendum)
Take antibiotic twice daily with food. Important to drink plenty of water throughout the day. Return for worsening urinary symptoms, blood in urine, abdominal or back pain, fever. 

## 2020-06-12 NOTE — ED Provider Notes (Signed)
[facility]    CSN: [id number] Arrival date & time: 06/12/20  0902      History   Chief Complaint Chief Complaint  Patient presents with  . Dysuria  . Exposure to STD    HPI Leslie Tucker is a 24 y.o. female presenting for possible UTI.  Endorsing vaginal discomfort, burning with urination and urinary frequency.  Does note odor at times.  Endorses history of BV: States this feels similar.  Currently sexually active with 1 female partner: Requesting STI check.  No pelvic pain abdominal pain, back pain, fever.   Past Medical History:  Diagnosis Date  . Hypertension   . SVT (supraventricular tachycardia) [facility])     Patient Active Problem List   Diagnosis Date Noted  . Depression with anxiety 12/28/2019  . Essential hypertension 11/27/2019  . Dysuria 11/27/2019  . SVT (supraventricular tachycardia) (HCC) 11/27/2019    Past Surgical History:  Procedure Laterality Date  . NO PAST SURGERIES      OB History    Gravida  2   Para  0   Term      Preterm      AB  1   Living  0     SAB      TAB      Ectopic      Multiple      Live Births               Home Medications    Prior to Admission medications   Medication Sig Start Date End Date Taking? Authorizing Provider  amLODipine (NORVASC) 5 MG tablet Take 1 tablet (5 mg total) by mouth daily. 01/14/20   Christen Butter, NP  buPROPion (WELLBUTRIN XL) 150 MG 24 hr tablet TAKE 1 TABLET BY MOUTH EVERY DAY 01/20/20   Christen Butter, NP  metoprolol succinate (TOPROL-XL) 50 MG 24 hr tablet Take 1 tablet (50 mg total) by mouth daily. Take with or immediately following a meal. 12/28/19   Christen Butter, NP    Family History Family History  Problem Relation Age of Onset  . Depression Mother   . ADD / ADHD Sister   . ADD / ADHD Brother     Social History Social History   Tobacco Use  . Smoking status: Never Smoker  . Smokeless tobacco: Never Used  Vaping Use  . Vaping Use: Never used  Substance  Use Topics  . Alcohol use: Yes    Comment: 4 drinks/week  . Drug use: Never     Allergies   Lexapro [escitalopram oxalate]   Review of Systems As per HPI   Physical Exam Triage Vital Signs ED Triage Vitals  Enc Vitals Group     BP      Pulse      Resp      Temp      Temp src      SpO2      Weight      Height      Head Circumference      Peak Flow      Pain Score      Pain Loc      Pain Edu?      Excl. in GC?    No data found.  Updated Vital Signs BP (!) 152/105 (BP Location: Left Arm)   Pulse (!) 106   Temp 99.1 F (37.3 C) (Oral)   Resp 18   SpO2 99%   Visual Acuity Right Eye Distance:  Left Eye Distance:   Bilateral Distance:    Right Eye Near:   Left Eye Near:    Bilateral Near:     Physical Exam Constitutional:      General: She is not in acute distress. HENT:     Head: Normocephalic and atraumatic.  Eyes:     General: No scleral icterus.    Pupils: Pupils are equal, round, and reactive to light.  Cardiovascular:     Rate and Rhythm: Normal rate.  Pulmonary:     Effort: Pulmonary effort is normal.  Abdominal:     General: Bowel sounds are normal.     Palpations: Abdomen is soft.     Tenderness: There is no abdominal tenderness. There is no right CVA tenderness, left CVA tenderness or guarding.  Genitourinary:    Comments: Patient declined, self-swab performed Skin:    Coloration: Skin is not jaundiced or pale.  Neurological:     Mental Status: She is alert and oriented to person, place, and time.      UC Treatments / Results  Labs (all labs ordered are listed, but only abnormal results are displayed) Labs Reviewed  POCT URINALYSIS DIP (MANUAL ENTRY) - Abnormal; Notable for the following components:      Result Value   Clarity, UA hazy (*)    Blood, UA trace-intact (*)    Leukocytes, UA Small (1+) (*)    All other components within normal limits  POCT URINE PREGNANCY - Normal  URINE CULTURE  CERVICOVAGINAL ANCILLARY ONLY      EKG   Radiology No results found.  Procedures Procedures (including critical care time)  Medications Ordered in UC Medications - No data to display  Initial Impression / Assessment and Plan / UC Course  I have reviewed the triage vital signs and the nursing notes.  Pertinent labs & imaging results that were available during my care of the patient were reviewed by me and considered in my medical decision making (see chart for details).     Urine pregnancy negative, urine with leukocytes and blood: Culture pending.  Will start Keflex in the interim given symptoms.  Cytology pending: We will avoid intercourse until results are back.  Return precautions discussed, pt verbalized understanding and is agreeable to plan. Final Clinical Impressions(s) / UC Diagnoses   Final diagnoses:  Acute cystitis with hematuria     Discharge Instructions     Take antibiotic twice daily with food. Important to drink plenty of water throughout the day. Return for worsening urinary symptoms, blood in urine, abdominal or back pain, fever.    ED Prescriptions    None     PDMP not reviewed this encounter.   Hall[place]Potvin, [place], [place] 06/12/20 [phone]

## 2020-06-12 NOTE — ED Triage Notes (Signed)
Pt here for vaginal area discomfort and frequency of urination; pt sts some odor and painful intercourse

## 2020-06-12 NOTE — Telephone Encounter (Signed)
Keflex sent, see UC note.

## 2020-06-14 LAB — CERVICOVAGINAL ANCILLARY ONLY
Chlamydia: NEGATIVE
Comment: NEGATIVE
Comment: NEGATIVE
Comment: NORMAL
Neisseria Gonorrhea: NEGATIVE
Trichomonas: NEGATIVE

## 2020-06-15 LAB — URINE CULTURE: Culture: >=100000 — AB

## 2020-06-29 ENCOUNTER — Other Ambulatory Visit: Payer: Self-pay | Admitting: Medical-Surgical

## 2020-06-29 DIAGNOSIS — I10 Essential (primary) hypertension: Secondary | ICD-10-CM

## 2021-02-26 DIAGNOSIS — Z419 Encounter for procedure for purposes other than remedying health state, unspecified: Secondary | ICD-10-CM | POA: Diagnosis not present

## 2021-02-26 LAB — HM PAP SMEAR: HM Pap smear: ABNORMAL

## 2021-03-20 ENCOUNTER — Telehealth: Payer: Self-pay | Admitting: General Practice

## 2021-03-20 NOTE — Telephone Encounter (Signed)
Transition Care Management Unsuccessful Follow-up Telephone Call  Date of discharge and from where:  [facility] 03/17/21  Attempts:  1st Attempt  Reason for unsuccessful TCM follow-up call:  Left voice message

## 2021-03-21 NOTE — Telephone Encounter (Signed)
Transition Care Management Unsuccessful Follow-up Telephone Call  Date of discharge and from where:  [facility] 03/17/21  Attempts:  2nd Attempt  Reason for unsuccessful TCM follow-up call:  Left voice message

## 2021-03-22 NOTE — Telephone Encounter (Signed)
Transition Care Management Unsuccessful Follow-up Telephone Call  Date of discharge and from where:  [facility] 03/17/21  Attempts:  3rd Attempt  Reason for unsuccessful TCM follow-up call:  Left voice message

## 2021-03-29 DIAGNOSIS — O165 Unspecified maternal hypertension, complicating the puerperium: Secondary | ICD-10-CM | POA: Diagnosis not present

## 2021-03-29 DIAGNOSIS — Z419 Encounter for procedure for purposes other than remedying health state, unspecified: Secondary | ICD-10-CM | POA: Diagnosis not present

## 2021-04-28 DIAGNOSIS — Z419 Encounter for procedure for purposes other than remedying health state, unspecified: Secondary | ICD-10-CM | POA: Diagnosis not present

## 2021-05-29 DIAGNOSIS — Z419 Encounter for procedure for purposes other than remedying health state, unspecified: Secondary | ICD-10-CM | POA: Diagnosis not present

## 2021-06-29 DIAGNOSIS — Z419 Encounter for procedure for purposes other than remedying health state, unspecified: Secondary | ICD-10-CM | POA: Diagnosis not present

## 2021-07-29 DIAGNOSIS — Z419 Encounter for procedure for purposes other than remedying health state, unspecified: Secondary | ICD-10-CM | POA: Diagnosis not present

## 2021-08-29 DIAGNOSIS — Z419 Encounter for procedure for purposes other than remedying health state, unspecified: Secondary | ICD-10-CM | POA: Diagnosis not present

## 2021-09-28 DIAGNOSIS — Z419 Encounter for procedure for purposes other than remedying health state, unspecified: Secondary | ICD-10-CM | POA: Diagnosis not present

## 2021-10-27 ENCOUNTER — Other Ambulatory Visit: Payer: Self-pay

## 2021-10-27 ENCOUNTER — Inpatient Hospital Stay (HOSPITAL_BASED_OUTPATIENT_CLINIC_OR_DEPARTMENT_OTHER)
Admission: EM | Admit: 2021-10-27 | Discharge: 2021-10-31 | DRG: 438 | Disposition: A | Discharge status: Home / Self Care | Payer: Medicaid Other | Attending: Internal Medicine | Admitting: Internal Medicine

## 2021-10-27 ENCOUNTER — Emergency Department (HOSPITAL_BASED_OUTPATIENT_CLINIC_OR_DEPARTMENT_OTHER): Payer: Medicaid Other

## 2021-10-27 ENCOUNTER — Encounter (HOSPITAL_BASED_OUTPATIENT_CLINIC_OR_DEPARTMENT_OTHER): Payer: Self-pay | Admitting: Emergency Medicine

## 2021-10-27 DIAGNOSIS — F10239 Alcohol dependence with withdrawal, unspecified: Secondary | ICD-10-CM | POA: Diagnosis not present

## 2021-10-27 DIAGNOSIS — K863 Pseudocyst of pancreas: Secondary | ICD-10-CM | POA: Diagnosis present

## 2021-10-27 DIAGNOSIS — F418 Other specified anxiety disorders: Secondary | ICD-10-CM | POA: Diagnosis present

## 2021-10-27 DIAGNOSIS — Z888 Allergy status to other drugs, medicaments and biological substances status: Secondary | ICD-10-CM | POA: Diagnosis not present

## 2021-10-27 DIAGNOSIS — Z79899 Other long term (current) drug therapy: Secondary | ICD-10-CM | POA: Diagnosis not present

## 2021-10-27 DIAGNOSIS — K852 Alcohol induced acute pancreatitis without necrosis or infection: Secondary | ICD-10-CM | POA: Diagnosis not present

## 2021-10-27 DIAGNOSIS — Y900 Blood alcohol level of less than 20 mg/100 ml: Secondary | ICD-10-CM | POA: Diagnosis present

## 2021-10-27 DIAGNOSIS — Y92239 Unspecified place in hospital as the place of occurrence of the external cause: Secondary | ICD-10-CM | POA: Diagnosis not present

## 2021-10-27 DIAGNOSIS — I471 Supraventricular tachycardia: Secondary | ICD-10-CM | POA: Diagnosis not present

## 2021-10-27 DIAGNOSIS — R112 Nausea with vomiting, unspecified: Secondary | ICD-10-CM

## 2021-10-27 DIAGNOSIS — Z20822 Contact with and (suspected) exposure to covid-19: Secondary | ICD-10-CM | POA: Diagnosis present

## 2021-10-27 DIAGNOSIS — E162 Hypoglycemia, unspecified: Secondary | ICD-10-CM | POA: Diagnosis present

## 2021-10-27 DIAGNOSIS — R000 Tachycardia, unspecified: Secondary | ICD-10-CM | POA: Diagnosis not present

## 2021-10-27 DIAGNOSIS — K226 Gastro-esophageal laceration-hemorrhage syndrome: Secondary | ICD-10-CM | POA: Diagnosis not present

## 2021-10-27 DIAGNOSIS — K869 Disease of pancreas, unspecified: Secondary | ICD-10-CM

## 2021-10-27 DIAGNOSIS — T886XXA Anaphylactic reaction due to adverse effect of correct drug or medicament properly administered, initial encounter: Secondary | ICD-10-CM | POA: Diagnosis not present

## 2021-10-27 DIAGNOSIS — R7401 Elevation of levels of liver transaminase levels: Secondary | ICD-10-CM | POA: Diagnosis not present

## 2021-10-27 DIAGNOSIS — F1010 Alcohol abuse, uncomplicated: Secondary | ICD-10-CM | POA: Diagnosis present

## 2021-10-27 DIAGNOSIS — K760 Fatty (change of) liver, not elsewhere classified: Secondary | ICD-10-CM | POA: Diagnosis not present

## 2021-10-27 DIAGNOSIS — E871 Hypo-osmolality and hyponatremia: Secondary | ICD-10-CM | POA: Diagnosis present

## 2021-10-27 DIAGNOSIS — R0682 Tachypnea, not elsewhere classified: Secondary | ICD-10-CM | POA: Diagnosis present

## 2021-10-27 DIAGNOSIS — K838 Other specified diseases of biliary tract: Secondary | ICD-10-CM | POA: Diagnosis present

## 2021-10-27 DIAGNOSIS — Z419 Encounter for procedure for purposes other than remedying health state, unspecified: Secondary | ICD-10-CM | POA: Diagnosis not present

## 2021-10-27 DIAGNOSIS — K920 Hematemesis: Secondary | ICD-10-CM | POA: Diagnosis not present

## 2021-10-27 DIAGNOSIS — E876 Hypokalemia: Secondary | ICD-10-CM | POA: Diagnosis present

## 2021-10-27 DIAGNOSIS — Y848 Other medical procedures as the cause of abnormal reaction of the patient, or of later complication, without mention of misadventure at the time of the procedure: Secondary | ICD-10-CM | POA: Diagnosis not present

## 2021-10-27 DIAGNOSIS — I10 Essential (primary) hypertension: Secondary | ICD-10-CM | POA: Diagnosis present

## 2021-10-27 DIAGNOSIS — T782XXA Anaphylactic shock, unspecified, initial encounter: Secondary | ICD-10-CM | POA: Diagnosis not present

## 2021-10-27 DIAGNOSIS — E8889 Other specified metabolic disorders: Secondary | ICD-10-CM

## 2021-10-27 DIAGNOSIS — K859 Acute pancreatitis without necrosis or infection, unspecified: Secondary | ICD-10-CM | POA: Diagnosis not present

## 2021-10-27 DIAGNOSIS — E872 Acidosis, unspecified: Secondary | ICD-10-CM | POA: Diagnosis present

## 2021-10-27 DIAGNOSIS — T447X5A Adverse effect of beta-adrenoreceptor antagonists, initial encounter: Secondary | ICD-10-CM | POA: Diagnosis not present

## 2021-10-27 DIAGNOSIS — E8729 Other acidosis: Secondary | ICD-10-CM | POA: Diagnosis present

## 2021-10-27 LAB — COMPREHENSIVE METABOLIC PANEL
ALT: 130 U/L — ABNORMAL HIGH (ref 0–44)
ALT: 95 U/L — ABNORMAL HIGH (ref 0–44)
ALT: 104 U/L — ABNORMAL HIGH (ref 0–44)
AST: 264 U/L — ABNORMAL HIGH (ref 15–41)
AST: 167 U/L — ABNORMAL HIGH (ref 15–41)
AST: 148 U/L — ABNORMAL HIGH (ref 15–41)
Albumin: 5 g/dL (ref 3.5–5.0)
Albumin: 4 g/dL (ref 3.5–5.0)
Albumin: 4.8 g/dL (ref 3.5–5.0)
Alkaline Phosphatase: 70 U/L (ref 38–126)
Alkaline Phosphatase: 60 U/L (ref 38–126)
Alkaline Phosphatase: 68 U/L (ref 38–126)
Anion gap: 24 — ABNORMAL HIGH (ref 5–15)
Anion gap: 20 — ABNORMAL HIGH (ref 5–15)
Anion gap: 13 (ref 5–15)
BUN: 6 mg/dL (ref 6–20)
BUN: 6 mg/dL (ref 6–20)
BUN: <5 mg/dL — ABNORMAL LOW (ref 6–20)
CO2: 19 mmol/L — ABNORMAL LOW (ref 22–32)
CO2: 17 mmol/L — ABNORMAL LOW (ref 22–32)
CO2: 22 mmol/L (ref 22–32)
Calcium: 10.3 mg/dL (ref 8.9–10.3)
Calcium: 8.6 mg/dL — ABNORMAL LOW (ref 8.9–10.3)
Calcium: 9.6 mg/dL (ref 8.9–10.3)
Chloride: 91 mmol/L — ABNORMAL LOW (ref 98–111)
Chloride: 97 mmol/L — ABNORMAL LOW (ref 98–111)
Chloride: 100 mmol/L (ref 98–111)
Creatinine, Ser: 0.72 mg/dL (ref 0.44–1.00)
Creatinine, Ser: 0.69 mg/dL (ref 0.44–1.00)
Creatinine, Ser: 0.77 mg/dL (ref 0.44–1.00)
GFR, Estimated: >60 mL/min (ref >60)
GFR, Estimated: >60 mL/min (ref >60)
GFR, Estimated: >60 mL/min (ref >60)
Glucose, Bld: 69 mg/dL — ABNORMAL LOW (ref 70–99)
Glucose, Bld: 62 mg/dL — ABNORMAL LOW (ref 70–99)
Glucose, Bld: 104 mg/dL — ABNORMAL HIGH (ref 70–99)
Potassium: 4 mmol/L (ref 3.5–5.1)
Potassium: 4.1 mmol/L (ref 3.5–5.1)
Potassium: 3.4 mmol/L — ABNORMAL LOW (ref 3.5–5.1)
Sodium: 134 mmol/L — ABNORMAL LOW (ref 135–145)
Sodium: 134 mmol/L — ABNORMAL LOW (ref 135–145)
Sodium: 135 mmol/L (ref 135–145)
Total Bilirubin: 2 mg/dL — ABNORMAL HIGH (ref 0.3–1.2)
Total Bilirubin: 1.6 mg/dL — ABNORMAL HIGH (ref 0.3–1.2)
Total Bilirubin: 2.5 mg/dL — ABNORMAL HIGH (ref 0.3–1.2)
Total Protein: 8.6 g/dL — ABNORMAL HIGH (ref 6.5–8.1)
Total Protein: 6.8 g/dL (ref 6.5–8.1)
Total Protein: 8.4 g/dL — ABNORMAL HIGH (ref 6.5–8.1)

## 2021-10-27 LAB — I-STAT VENOUS BLOOD GAS, ED
Acid-base deficit: 5 mmol/L — ABNORMAL HIGH (ref 0.0–2.0)
Bicarbonate: 19.7 mmol/L — ABNORMAL LOW (ref 20.0–28.0)
Calcium, Ion: 1.24 mmol/L (ref 1.15–1.40)
HCT: 39 % (ref 36.0–46.0)
Hemoglobin: 13.3 g/dL (ref 12.0–15.0)
O2 Saturation: 53 %
Potassium: 3.8 mmol/L (ref 3.5–5.1)
Sodium: 132 mmol/L — ABNORMAL LOW (ref 135–145)
TCO2: 21 mmol/L — ABNORMAL LOW (ref 22–32)
pCO2, Ven: 35.3 mmHg — ABNORMAL LOW (ref 44.0–60.0)
pH, Ven: 7.356 (ref 7.250–7.430)
pO2, Ven: 29 mmHg — CL (ref 32.0–45.0)

## 2021-10-27 LAB — BETA-HYDROXYBUTYRIC ACID: Beta-Hydroxybutyric Acid: 3.13 mmol/L — ABNORMAL HIGH (ref 0.05–0.27)

## 2021-10-27 LAB — URINALYSIS, ROUTINE W REFLEX MICROSCOPIC
Bilirubin Urine: NEGATIVE
Glucose, UA: NEGATIVE mg/dL
Hgb urine dipstick: NEGATIVE
Ketones, ur: >80 mg/dL — AB
Leukocytes,Ua: NEGATIVE
Nitrite: NEGATIVE
Protein, ur: 30 mg/dL — AB
Specific Gravity, Urine: 1.027 (ref 1.005–1.030)
pH: 5.5 (ref 5.0–8.0)

## 2021-10-27 LAB — PROTIME-INR
INR: 1.1 (ref 0.8–1.2)
Prothrombin Time: 14 seconds (ref 11.4–15.2)

## 2021-10-27 LAB — CREATININE, SERUM
Creatinine, Ser: 0.74 mg/dL (ref 0.44–1.00)
GFR, Estimated: >60 mL/min (ref >60)

## 2021-10-27 LAB — CBC WITH DIFFERENTIAL/PLATELET
Abs Immature Granulocytes: 0.01 10*3/uL (ref 0.00–0.07)
Basophils Absolute: 0.1 10*3/uL (ref 0.0–0.1)
Basophils Relative: 2 %
Eosinophils Absolute: 0.2 10*3/uL (ref 0.0–0.5)
Eosinophils Relative: 5 %
HCT: 44 % (ref 36.0–46.0)
Hemoglobin: 14.8 g/dL (ref 12.0–15.0)
Immature Granulocytes: 0 %
Lymphocytes Relative: 27 %
Lymphs Abs: 1.3 10*3/uL (ref 0.7–4.0)
MCH: 35.4 pg — ABNORMAL HIGH (ref 26.0–34.0)
MCHC: 33.6 g/dL (ref 30.0–36.0)
MCV: 105.3 fL — ABNORMAL HIGH (ref 80.0–100.0)
Monocytes Absolute: 0.7 10*3/uL (ref 0.1–1.0)
Monocytes Relative: 14 %
Neutro Abs: 2.5 10*3/uL (ref 1.7–7.7)
Neutrophils Relative %: 52 %
Platelets: 223 10*3/uL (ref 150–400)
RBC: 4.18 MIL/uL (ref 3.87–5.11)
RDW: 13.6 % (ref 11.5–15.5)
WBC: 4.7 10*3/uL (ref 4.0–10.5)
nRBC: 0 % (ref 0.0–0.2)

## 2021-10-27 LAB — CBC
HCT: 37 % (ref 36.0–46.0)
Hemoglobin: 12.5 g/dL (ref 12.0–15.0)
MCH: 35.3 pg — ABNORMAL HIGH (ref 26.0–34.0)
MCHC: 33.8 g/dL (ref 30.0–36.0)
MCV: 104.5 fL — ABNORMAL HIGH (ref 80.0–100.0)
Platelets: 175 10*3/uL (ref 150–400)
RBC: 3.54 MIL/uL — ABNORMAL LOW (ref 3.87–5.11)
RDW: 13.5 % (ref 11.5–15.5)
WBC: 4.1 10*3/uL (ref 4.0–10.5)
nRBC: 0 % (ref 0.0–0.2)

## 2021-10-27 LAB — RAPID URINE DRUG SCREEN, HOSP PERFORMED
Amphetamines: NOT DETECTED
Barbiturates: NOT DETECTED
Benzodiazepines: NOT DETECTED
Cocaine: NOT DETECTED
Opiates: NOT DETECTED
Tetrahydrocannabinol: NOT DETECTED

## 2021-10-27 LAB — HIV ANTIBODY (ROUTINE TESTING W REFLEX): HIV Screen 4th Generation wRfx: NONREACTIVE

## 2021-10-27 LAB — ETHANOL
Alcohol, Ethyl (B): <10 mg/dL (ref <10)
Alcohol, Ethyl (B): <10 mg/dL (ref <10)

## 2021-10-27 LAB — RESP PANEL BY RT-PCR (FLU A&B, COVID) ARPGX2
Influenza A by PCR: NEGATIVE
Influenza B by PCR: NEGATIVE
SARS Coronavirus 2 by RT PCR: NEGATIVE

## 2021-10-27 LAB — LACTIC ACID, PLASMA: Lactic Acid, Venous: 0.9 mmol/L (ref 0.5–1.9)

## 2021-10-27 LAB — OCCULT BLOOD X 1 CARD TO LAB, STOOL: Fecal Occult Bld: NEGATIVE

## 2021-10-27 LAB — PREGNANCY, URINE: Preg Test, Ur: NEGATIVE

## 2021-10-27 LAB — LIPASE, BLOOD: Lipase: 41 U/L (ref 11–51)

## 2021-10-27 MED ORDER — LORAZEPAM 2 MG/ML IJ SOLN
0.0000 mg | Freq: Four times a day (QID) | INTRAMUSCULAR | Status: AC
  Administered 2021-10-27 – 2021-10-28 (×3): 1 mg via INTRAVENOUS
  Filled 2021-10-27 (×3): qty 1

## 2021-10-27 MED ORDER — THIAMINE HCL 100 MG PO TABS
100.0000 mg | ORAL_TABLET | Freq: Every day | ORAL | Status: DC
  Administered 2021-10-27 – 2021-10-31 (×5): 100 mg via ORAL
  Filled 2021-10-27 (×5): qty 1

## 2021-10-27 MED ORDER — DEXTROSE IN LACTATED RINGERS 5 % IV SOLN: INTRAVENOUS | Status: DC

## 2021-10-27 MED ORDER — THIAMINE HCL 100 MG/ML IJ SOLN
100.0000 mg | Freq: Every day | INTRAMUSCULAR | Status: DC
  Filled 2021-10-27: qty 2

## 2021-10-27 MED ORDER — LORAZEPAM 2 MG/ML IJ SOLN
1.0000 mg | INTRAMUSCULAR | Status: AC | PRN
  Administered 2021-10-28: 1 mg via INTRAVENOUS

## 2021-10-27 MED ORDER — ONDANSETRON HCL 4 MG PO TABS: 4.0000 mg | ORAL_TABLET | Freq: Four times a day (QID) | ORAL | Status: DC | PRN

## 2021-10-27 MED ORDER — LORAZEPAM 1 MG PO TABS: 1.0000 mg | ORAL_TABLET | ORAL | Status: AC | PRN

## 2021-10-27 MED ORDER — SODIUM CHLORIDE 0.9 % IV SOLN: INTRAVENOUS | Status: DC

## 2021-10-27 MED ORDER — ONDANSETRON HCL 4 MG/2ML IJ SOLN
4.0000 mg | Freq: Once | INTRAMUSCULAR | Status: AC
  Administered 2021-10-27: 05:00:00 4 mg via INTRAVENOUS
  Filled 2021-10-27: qty 2

## 2021-10-27 MED ORDER — ACETAMINOPHEN 650 MG RE SUPP: 650.0000 mg | Freq: Four times a day (QID) | RECTAL | Status: DC | PRN

## 2021-10-27 MED ORDER — ACETAMINOPHEN 325 MG PO TABS
650.0000 mg | ORAL_TABLET | Freq: Four times a day (QID) | ORAL | Status: DC | PRN
  Administered 2021-10-27 – 2021-10-30 (×4): 650 mg via ORAL
  Filled 2021-10-27 (×5): qty 2

## 2021-10-27 MED ORDER — MELATONIN 5 MG PO TABS
5.0000 mg | ORAL_TABLET | Freq: Once | ORAL | Status: AC
  Administered 2021-10-27: 23:00:00 5 mg via ORAL
  Filled 2021-10-27: qty 1

## 2021-10-27 MED ORDER — KETOROLAC TROMETHAMINE 30 MG/ML IJ SOLN
30.0000 mg | Freq: Once | INTRAMUSCULAR | Status: AC
  Administered 2021-10-27: 10:00:00 30 mg via INTRAVENOUS
  Filled 2021-10-27: qty 1

## 2021-10-27 MED ORDER — IOHEXOL 300 MG/ML  SOLN
100.0000 mL | Freq: Once | INTRAMUSCULAR | Status: AC | PRN
  Administered 2021-10-27: 06:00:00 100 mL via INTRAVENOUS

## 2021-10-27 MED ORDER — DEXTROSE-NACL 5-0.9 % IV SOLN: INTRAVENOUS | Status: DC

## 2021-10-27 MED ORDER — LIP MEDEX EX OINT
TOPICAL_OINTMENT | CUTANEOUS | Status: DC | PRN
  Administered 2021-10-28: 75 via TOPICAL
  Filled 2021-10-27: qty 7

## 2021-10-27 MED ORDER — PANTOPRAZOLE SODIUM 40 MG IV SOLR
40.0000 mg | Freq: Two times a day (BID) | INTRAVENOUS | Status: DC
  Administered 2021-10-27 – 2021-10-31 (×8): 40 mg via INTRAVENOUS
  Filled 2021-10-27 (×8): qty 40

## 2021-10-27 MED ORDER — PANTOPRAZOLE SODIUM 40 MG IV SOLR
40.0000 mg | Freq: Once | INTRAVENOUS | Status: AC
  Administered 2021-10-27: 05:00:00 40 mg via INTRAVENOUS
  Filled 2021-10-27: qty 40

## 2021-10-27 MED ORDER — LORAZEPAM 2 MG/ML IJ SOLN
0.0000 mg | Freq: Two times a day (BID) | INTRAMUSCULAR | Status: AC
  Filled 2021-10-27: qty 1

## 2021-10-27 MED ORDER — SUCRALFATE 1 GM/10ML PO SUSP
1.0000 g | Freq: Three times a day (TID) | ORAL | Status: DC
  Administered 2021-10-27 – 2021-10-31 (×14): 1 g via ORAL
  Filled 2021-10-27 (×14): qty 10

## 2021-10-27 MED ORDER — ADULT MULTIVITAMIN W/MINERALS CH
1.0000 | ORAL_TABLET | Freq: Every day | ORAL | Status: DC
  Administered 2021-10-27 – 2021-10-31 (×5): 1 via ORAL
  Filled 2021-10-27 (×5): qty 1

## 2021-10-27 MED ORDER — FOLIC ACID 1 MG PO TABS
1.0000 mg | ORAL_TABLET | Freq: Every day | ORAL | Status: DC
  Administered 2021-10-27 – 2021-10-31 (×5): 1 mg via ORAL
  Filled 2021-10-27 (×5): qty 1

## 2021-10-27 MED ORDER — ONDANSETRON HCL 4 MG/2ML IJ SOLN
4.0000 mg | Freq: Four times a day (QID) | INTRAMUSCULAR | Status: DC | PRN
  Administered 2021-10-29 – 2021-10-30 (×4): 4 mg via INTRAVENOUS
  Filled 2021-10-27 (×4): qty 2

## 2021-10-27 MED ORDER — ENOXAPARIN SODIUM 40 MG/0.4ML IJ SOSY
40.0000 mg | PREFILLED_SYRINGE | INTRAMUSCULAR | Status: DC
  Administered 2021-10-27: 10:00:00 40 mg via SUBCUTANEOUS
  Filled 2021-10-27: qty 0.4

## 2021-10-27 MED ORDER — METOCLOPRAMIDE HCL 5 MG/ML IJ SOLN
10.0000 mg | Freq: Once | INTRAMUSCULAR | Status: AC
  Administered 2021-10-27: 10:00:00 10 mg via INTRAVENOUS
  Filled 2021-10-27: qty 2

## 2021-10-27 MED ORDER — SODIUM CHLORIDE 0.9 % IV BOLUS
1000.0000 mL | Freq: Once | INTRAVENOUS | Status: AC
  Administered 2021-10-27: 06:00:00 1000 mL via INTRAVENOUS

## 2021-10-27 NOTE — Assessment & Plan Note (Signed)
Monitor

## 2021-10-27 NOTE — Assessment & Plan Note (Signed)
In the setting of poor p.o. intake. Currently being replaced with IV fluid.

## 2021-10-27 NOTE — ED Provider Notes (Signed)
Admitted to hospitalist at [facility].  Pt stable, started on D5 NS infusion here for hypoglycemia and suspected alcohol ketosis.   Terald Sleeper, MD 10/27/21 [phone]

## 2021-10-27 NOTE — ED Notes (Signed)
[facility] here to pick up pt. Report given to floor and carelink

## 2021-10-27 NOTE — ED Triage Notes (Signed)
°  Patient comes in with mid abdominal pain that started yesterday afternoon.  Patient states she was nauseous and had had issues keeping fluids.  No diarrhea.  Patient took pepto bismol around 2000 last night and woke up at 0030 this morning with hemataemesis.  Patient states she threw up a "large" amount of blood before coming to be seen.  Patient states it was dark red, no clots.  Pain 3/10, dull pain above umbilicus.

## 2021-10-27 NOTE — Assessment & Plan Note (Signed)
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 Will perform MRI pancreatic protocol.

## 2021-10-27 NOTE — Assessment & Plan Note (Signed)
Patient presents with complaints of frequent episodes of nausea and vomiting. That she started having blood in the vomit and therefore she decided come to the hospital.  She initially had some epigastric pain.  Currently resolved. Suspect this is Mallory-Weiss tear. IV Protonix, Carafate.  H&H relatively stable. May require GI consult if continues to have vomiting or bleeding.

## 2021-10-27 NOTE — Plan of Care (Signed)
25 year old female with a history of hypertension and depression and alcohol abuse admitted with multiple episodes nausea vomiting and hematemesis.  She denies melena. Work-up shows she has a anion gap acidosis with urine positive for ketones crystals and proteins. Anion gap is improving. Added urine drug screen methanol and ethylene glycol levels. Patient was hypotensive and tachycardic in the ER.  She was started on D5 NS in the ER. Will admit patient to progressive care at [facility] for EtOH abuse, EtOH withdrawal, high gap acidosis, ketosis, hematemesis.  Hemoglobin remained stable.

## 2021-10-27 NOTE — H&P (Signed)
History and Physical    PatientCesilia Tucker GYI:[id number] DOB: 14-Mar-1996 DOA: 10/27/2021 DOS: the patient was seen and examined on 10/27/2021 PCP: Christen Butter, NP  Patient coming from: Home  Chief Complaint: Nausea vomiting of blood  HPI: Leslie Tucker is a 25 y.o. female with medical history significant of alcohol abuse, hypertension.  Not on medication.  Presents with complaints of nausea and vomiting followed by vomiting of blood. Currently denies any abdominal pain but did have some epigastric pain earlier. No fever no chills.  No diarrhea or constipation. No bleeding anywhere else. She does not take any medication like ibuprofen Aleve naproxen.  Does not take any blood thinners or daily water as well. She normally drinks 3 midsized bottle of wine per day.  Twice a week drinks heavily with light beer. Denies any drug abuse. Denies any smoking. Does not use any medication at baseline. No prior episode of vomiting of blood.  Review of Systems: ROS Past Medical History:  Diagnosis Date   Hypertension    SVT (supraventricular tachycardia) (HCC)    Past Surgical History:  Procedure Laterality Date   NO PAST SURGERIES     Social History:  reports that she has never smoked. She has never used smokeless tobacco. She reports current alcohol use. She reports that she does not use drugs.  Allergies  Allergen Reactions   Lexapro [Escitalopram Oxalate] Shortness Of Breath and Palpitations    Shivers, muscle twitches, heavy sweating, auditory hallucinations    Family History  Problem Relation Age of Onset   Depression Mother    ADD / ADHD Sister    ADD / ADHD Brother     Prior to Admission medications   Not on File    Physical Exam: Vitals:   10/27/21 1522 10/27/21 1522 10/27/21 1757 10/27/21 1942  BP:  (!) 143/93 (!) 134/103 (!) 147/105  Pulse:  (!) 105 93 97  Resp:  18  14  Temp:  98.3 F (36.8 C)  99.4 F (37.4 C)  TempSrc:  Oral  Oral  SpO2:  100%  100% 100%  Weight: 71.5 kg     Height: 5\' 2"  (1.575 m)      General: Appear in mild distress, no Rash; Oral Mucosa Clear, moist. no Abnormal Neck Mass Or lumps, Conjunctiva normal  Cardiovascular: S1 and S2 Present, no Murmur, Respiratory: good respiratory effort, Bilateral Air entry present and CTA, no Crackles, no wheezes Abdomen: Bowel Sound present, Soft and no tenderness Extremities: no Pedal edema Neurology: alert and oriented to time, place, and person affect appropriate. no new focal deficit Gait not checked due to patient safety concerns   Data Reviewed: Hemoconcentration.  Macrocytosis.  Hypokalemia, hyponatremia, metabolic acidosis with anion gap of 24.  AST ALT elevation consistent with alcohol abuse.  Hyperbilirubinemia.  CT abdomen pancreatic lesion..  Assessment/Plan * Hematemesis- (present on admission) Patient presents with complaints of frequent episodes of nausea and vomiting. That she started having blood in the vomit and therefore she decided come to the hospital.  She initially had some epigastric pain.  Currently resolved. Suspect this is Mallory-Weiss tear. IV Protonix, Carafate.  H&H relatively stable. May require GI consult if continues to have vomiting or bleeding.  High anion gap metabolic acidosis- (present on admission) Anion gap 20 at the time of admission in ER. Currently resolved. Monitor.  Lactic acid normal. Beta hydroxybutyric acid higher suspect alcohol induced ketoacidosis.  Hypokalemia- (present on admission) In the setting of poor p.o. intake and nausea vomiting.  Currently replacing.  Monitor.  Hyponatremia- (present on admission) In the setting of poor p.o. intake. Currently being replaced with IV fluid.  Alcohol abuse- (present on admission) Patient reports wine drinking on a regular basis.  Intermittently mixed with beer.  Twice a week she may have significant alcohol intake.  Intermittently she will also stop drinking which will lead  to alcohol use withdrawal symptoms at home and occasionally has seizures.  Last episode of seizure was 4 months ago. Currently not willing to quit. Continue CIWA protocol.  Sinus tachycardia- (present on admission) In the setting of alcohol withdrawal.  Monitor.  Tachypnea- (present on admission) Monitor.  Transaminitis- (present on admission) LFT elevated in setting of alcohol intake. Daily monitoring.  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 Will perform MRI pancreatic protocol.    Advance Care Planning:   Code Status: Full Code   Consults: None  Family Communication: None at bedside  Severity of Illness: The appropriate patient status for this patient is INPATIENT. Inpatient status is judged to be reasonable and necessary in order to provide the required intensity of service to ensure the patient's safety. The patient's presenting symptoms, physical exam findings, and initial radiographic and laboratory data in the context of their chronic comorbidities is felt to place them at high risk for further clinical deterioration. Furthermore, it is not anticipated that the patient will be medically stable for discharge from the hospital within 2 midnights of admission.   * I certify that at the point of admission it is my clinical judgment that the patient will require inpatient hospital care spanning beyond 2 midnights from the point of admission due to high intensity of service, high risk for further deterioration and high frequency of surveillance required.*  Author: Lynden Oxford 10/27/2021 9:30 PM  For on call review [email or web address].

## 2021-10-27 NOTE — Assessment & Plan Note (Addendum)
Anion gap 20 at the time of admission in ER. Currently resolved. Monitor.  Lactic acid normal. Beta hydroxybutyric acid higher suspect alcohol induced ketoacidosis.

## 2021-10-27 NOTE — ED Notes (Signed)
Pt up to bathroom  Pt can eat per MD

## 2021-10-27 NOTE — Assessment & Plan Note (Signed)
In the setting of alcohol withdrawal.  Monitor.

## 2021-10-27 NOTE — Assessment & Plan Note (Signed)
In the setting of poor p.o. intake and nausea vomiting. Currently replacing.  Monitor.

## 2021-10-27 NOTE — Assessment & Plan Note (Signed)
Patient reports wine drinking on a regular basis.  Intermittently mixed with beer.  Twice a week she may have significant alcohol intake.  Intermittently she will also stop drinking which will lead to alcohol use withdrawal symptoms at home and occasionally has seizures.  Last episode of seizure was 4 months ago. Currently not willing to quit. Continue CIWA protocol.

## 2021-10-27 NOTE — ED Notes (Signed)
Attempted report to [employer]

## 2021-10-27 NOTE — Assessment & Plan Note (Signed)
LFT elevated in setting of alcohol intake. Daily monitoring.

## 2021-10-27 NOTE — ED Provider Notes (Signed)
[facility] Provider Note   CSN: [id number] Arrival date & time: 10/27/21  0121     History Chief Complaint  Patient presents with   Abdominal Pain   Hematemesis    Leslie Tucker is a 25 y.o. female.  Patient with a history of alcohol abuse presenting with diffuse abdominal pain with nausea and vomiting and hematemesis.  States she developed nausea yesterday morning with multiple episodes of clear yellow emesis throughout the day.  Had progressively worsening epigastric and diffuse abdominal pain difficulty trying to drink fluids all day long due to vomiting.  Around midnight tonight she threw up "large amount of what she thinks was blood.  States there were dark clots mixed in with clear vomit.  This happened twice.  Still having nausea.  No black or bloody stools.  Still having upper abdominal pain.  Reports that she abuses alcohol and drinks wine on a regular basis and has gone through withdrawals in the past.  States she normally drinks 3 midsize bottles of wine per day had a small amount last night.  Believes she has gone through withdrawal symptoms in the past and had a seizure but never been hospitalized for it. She is never had an EGD.  She denies any excessive use of NSAIDs.  No history of varices or ulcers that she knows of. No chest pain or shortness of breath.  No lightheadedness or dizziness  The history is provided by the patient.  Abdominal Pain Associated symptoms: nausea and vomiting   Associated symptoms: no chest pain, no cough, no dysuria, no fatigue, no fever, no hematuria and no shortness of breath       Past Medical History:  Diagnosis Date   Hypertension    SVT (supraventricular tachycardia) ([place])     Patient Active Problem List   Diagnosis Date Noted   Depression with anxiety 12/28/2019   Essential hypertension 11/27/2019   Dysuria 11/27/2019   SVT (supraventricular tachycardia) ([place]) 11/27/2019    Past Surgical  History:  Procedure Laterality Date   NO PAST SURGERIES       OB History     Gravida  2   Para  0   Term      Preterm      AB  1   Living  0      SAB      IAB      Ectopic      Multiple      Live Births              Family History  Problem Relation Age of Onset   Depression Mother    ADD / ADHD Sister    ADD / ADHD Brother     Social History   Tobacco Use   Smoking status: Never   Smokeless tobacco: Never  Vaping Use   Vaping Use: Never used  Substance Use Topics   Alcohol use: Yes    Comment: 4 drinks/week   Drug use: Never    Home Medications Prior to Admission medications   Medication Sig Start Date End Date Taking? Authorizing Provider  amLODipine (NORVASC) 5 MG tablet TAKE 1 TABLET BY MOUTH EVERY DAY 06/29/20   Samuel Bouche, NP  buPROPion (WELLBUTRIN XL) 150 MG 24 hr tablet TAKE 1 TABLET BY MOUTH EVERY DAY 01/20/20   Samuel Bouche, NP  metoprolol succinate (TOPROL-XL) 50 MG 24 hr tablet Take 1 tablet (50 mg total) by mouth daily. Take with or immediately following  a meal. 12/28/19   Christen Butter, NP    Allergies    Lexapro [escitalopram oxalate]  Review of Systems   Review of Systems  Constitutional:  Positive for activity change and appetite change. Negative for fatigue and fever.  HENT:  Negative for congestion and rhinorrhea.   Eyes:  Negative for visual disturbance.  Respiratory:  Negative for cough, chest tightness and shortness of breath.   Cardiovascular:  Negative for chest pain.  Gastrointestinal:  Positive for abdominal pain, nausea and vomiting. Negative for blood in stool.  Genitourinary:  Negative for dysuria and hematuria.  Musculoskeletal:  Negative for arthralgias.  Neurological:  Negative for dizziness, weakness and headaches.   all other systems are negative except as noted in the HPI and PMH.   Physical Exam Updated Vital Signs BP (!) 173/115 (BP Location: Right Arm)    Pulse (!) 122    Temp 98.4 F (36.9 C) (Oral)     Resp 20    Ht 5\' 2"  (1.575 m)    Wt 70.3 kg    SpO2 100%    BMI 28.35 kg/m   Physical Exam Vitals and nursing note reviewed.  Constitutional:      General: She is not in acute distress.    Appearance: She is well-developed.  HENT:     Head: Normocephalic and atraumatic.     Mouth/Throat:     Pharynx: No oropharyngeal exudate.  Eyes:     Conjunctiva/sclera: Conjunctivae normal.     Pupils: Pupils are equal, round, and reactive to light.  Neck:     Comments: No meningismus. Cardiovascular:     Rate and Rhythm: Regular rhythm. Tachycardia present.     Heart sounds: Normal heart sounds. No murmur heard. Pulmonary:     Effort: Pulmonary effort is normal. No respiratory distress.     Breath sounds: Normal breath sounds.  Abdominal:     Palpations: Abdomen is soft.     Tenderness: There is abdominal tenderness. There is no guarding or rebound.     Comments: Epigastric tenderness, no guarding or rebound.  No lower abdominal tenderness  Musculoskeletal:        General: No tenderness. Normal range of motion.     Cervical back: Normal range of motion and neck supple.  Skin:    General: Skin is warm.     Capillary Refill: Capillary refill takes less than 2 seconds.  Neurological:     General: No focal deficit present.     Mental Status: She is alert and oriented to person, place, and time. Mental status is at baseline.     Cranial Nerves: No cranial nerve deficit.     Motor: No abnormal muscle tone.     Coordination: Coordination normal.     Comments:  5/5 strength throughout. CN 2-12 intact.Equal grip strength.   Psychiatric:        Behavior: Behavior normal.    ED Results / Procedures / Treatments   Labs (all labs ordered are listed, but only abnormal results are displayed) Labs Reviewed  URINALYSIS, ROUTINE W REFLEX MICROSCOPIC - Abnormal; Notable for the following components:      Result Value   Ketones, ur >80 (*)    Protein, ur 30 (*)    Bacteria, UA RARE (*)     Crystals PRESENT (*)    All other components within normal limits  CBC WITH DIFFERENTIAL/PLATELET - Abnormal; Notable for the following components:   MCV 105.3 (*)    MCH 35.4 (*)  All other components within normal limits  COMPREHENSIVE METABOLIC PANEL - Abnormal; Notable for the following components:   Sodium 134 (*)    Chloride 91 (*)    CO2 19 (*)    Glucose, Bld 69 (*)    Total Protein 8.6 (*)    AST 264 (*)    ALT 130 (*)    Total Bilirubin 2.0 (*)    Anion gap 24 (*)    All other components within normal limits  PREGNANCY, URINE  LIPASE, BLOOD  OCCULT BLOOD X 1 CARD TO LAB, STOOL  ETHANOL  PROTIME-INR    EKG EKG Interpretation  Date/Time:  Friday October 27 2021 05:33:40 EST Ventricular Rate:  127 PR Interval:  135 QRS Duration: 122 QT Interval:  370 QTC Calculation: 538 R Axis:   31 Text Interpretation: Sinus tachycardia Atrial premature complex Nonspecific intraventricular conduction delay Nonspecific T abnormalities, inferior leads Baseline wander in lead(s) V2 Nonspecific T wave abnormality Confirmed by Ezequiel Essex [phone]) on 10/27/2021 5:45:49 AM  Radiology CT ABDOMEN PELVIS W CONTRAST  Result Date: 10/27/2021 CLINICAL DATA:  Epigastric pain EXAM: CT ABDOMEN AND PELVIS WITH CONTRAST TECHNIQUE: Multidetector CT imaging of the abdomen and pelvis was performed using the standard protocol following bolus administration of intravenous contrast. CONTRAST:  177mL OMNIPAQUE IOHEXOL 300 MG/ML  SOLN COMPARISON:  None. FINDINGS: Lower chest:  No contributory findings. Hepatobiliary: Marked hepatic steatosis.No evidence of biliary obstruction or stone. Pancreas: Relative low-density between the duodenal C-loop and pancreatic head measuring up to 16 mm. No pancreatic expansion or adjacent fat inflammation. Spleen: Unremarkable. Adrenals/Urinary Tract: Negative adrenals. No hydronephrosis or stone. Unremarkable bladder. Stomach/Bowel:  No obstruction. No appendicitis.  Vascular/Lymphatic: No acute vascular abnormality. No mass or adenopathy. Reproductive:No pathologic findings. Abdominal wall scarring which may be from Cesarian Other: No ascites or pneumoperitoneum. Musculoskeletal: No acute abnormalities. IMPRESSION: 1. Low-density at the pancreaticoduodenal groove which is indeterminate. Ill-defined pseudo cyst, mass, or diverticulum are all considered given location. After resolution of the acute episode, recommend pancreas protocol MRI with contrast. 2. Marked hepatic steatosis. Electronically Signed   By: Jorje Guild M.D.   On: 10/27/2021 06:45    Procedures Procedures   Medications Ordered in ED Medications  sodium chloride 0.9 % bolus 1,000 mL (has no administration in time range)  ondansetron (ZOFRAN) injection 4 mg (has no administration in time range)  pantoprazole (PROTONIX) injection 40 mg (has no administration in time range)  LORazepam (ATIVAN) tablet 1-4 mg (has no administration in time range)    Or  LORazepam (ATIVAN) injection 1-4 mg (has no administration in time range)  thiamine tablet 100 mg (has no administration in time range)    Or  thiamine (B-1) injection 100 mg (has no administration in time range)  folic acid (FOLVITE) tablet 1 mg (has no administration in time range)  multivitamin with minerals tablet 1 tablet (has no administration in time range)  LORazepam (ATIVAN) injection 0-4 mg (has no administration in time range)    Followed by  LORazepam (ATIVAN) injection 0-4 mg (has no administration in time range)    ED Course  I have reviewed the triage vital signs and the nursing notes.  Pertinent labs & imaging results that were available during my care of the patient were reviewed by me and considered in my medical decision making (see chart for details).    MDM Rules/Calculators/A&P  Upper abdominal pain with nausea and vomiting and possible hematemesis.  Patient is tachycardic and hypertensive  on arrival.  Abdomen is soft without peritoneal signs.  Labs show anion gap acidosis of 24 with transaminitis.  Large ketones in urine  Patient started on IV fluids as well as IV Protonix and CIWA protocol.  CT as above shows abnormality to the pancreas concerning for mass versus cyst.  Patient informed of same need for MRI follow-up.  She remains tachycardic as well as hypertensive.  Repeat labs show lactic acidosis with anion gap.  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   May have some element of alcohol withdrawal contributed to her hypertension tachycardia as well though her CIWA scores are low.  Will continue to give IV hydration as well as CIWA protocol to prevent withdrawal.  No vomiting seen in the ED.  Admission plan for alcoholic ketosis and persistent tachycardia and hypertension with possible element of alcohol withdrawal. Call to hospitalist pending at shift change    Final Clinical Impression(s) / ED Diagnoses Final diagnoses:  None    Rx / DC Orders ED Discharge Orders     None        Prim Morace, Annie Main, MD 10/27/21 [phone]

## 2021-10-27 NOTE — Discharge Instructions (Signed)
Your CT scan shows an abnormality to your pancreas which could be a cyst or a mass.  Needs further evaluation by MRI once her symptoms improved. Establish care with a primary doctor and gastroenterologist. You should return to the ED with worsening pain, vomiting, vomiting blood, fever, or other concerns.

## 2021-10-28 ENCOUNTER — Inpatient Hospital Stay (HOSPITAL_COMMUNITY): Payer: Medicaid Other

## 2021-10-28 DIAGNOSIS — K828 Other specified diseases of gallbladder: Secondary | ICD-10-CM | POA: Diagnosis not present

## 2021-10-28 DIAGNOSIS — R7401 Elevation of levels of liver transaminase levels: Secondary | ICD-10-CM | POA: Diagnosis not present

## 2021-10-28 DIAGNOSIS — R000 Tachycardia, unspecified: Secondary | ICD-10-CM

## 2021-10-28 DIAGNOSIS — K760 Fatty (change of) liver, not elsewhere classified: Secondary | ICD-10-CM | POA: Diagnosis not present

## 2021-10-28 DIAGNOSIS — K863 Pseudocyst of pancreas: Secondary | ICD-10-CM

## 2021-10-28 DIAGNOSIS — K862 Cyst of pancreas: Secondary | ICD-10-CM | POA: Diagnosis not present

## 2021-10-28 DIAGNOSIS — E8729 Other acidosis: Secondary | ICD-10-CM

## 2021-10-28 DIAGNOSIS — E871 Hypo-osmolality and hyponatremia: Secondary | ICD-10-CM | POA: Diagnosis not present

## 2021-10-28 DIAGNOSIS — F1010 Alcohol abuse, uncomplicated: Secondary | ICD-10-CM

## 2021-10-28 DIAGNOSIS — E876 Hypokalemia: Secondary | ICD-10-CM | POA: Diagnosis not present

## 2021-10-28 DIAGNOSIS — K920 Hematemesis: Secondary | ICD-10-CM | POA: Diagnosis not present

## 2021-10-28 LAB — COMPREHENSIVE METABOLIC PANEL
ALT: 91 U/L — ABNORMAL HIGH (ref 0–44)
AST: 158 U/L — ABNORMAL HIGH (ref 15–41)
Albumin: 3.6 g/dL (ref 3.5–5.0)
Alkaline Phosphatase: 51 U/L (ref 38–126)
Anion gap: 8 (ref 5–15)
BUN: <5 mg/dL — ABNORMAL LOW (ref 6–20)
CO2: 21 mmol/L — ABNORMAL LOW (ref 22–32)
Calcium: 9.1 mg/dL (ref 8.9–10.3)
Chloride: 109 mmol/L (ref 98–111)
Creatinine, Ser: 0.57 mg/dL (ref 0.44–1.00)
GFR, Estimated: >60 mL/min (ref >60)
Glucose, Bld: 99 mg/dL (ref 70–99)
Potassium: 4.3 mmol/L (ref 3.5–5.1)
Sodium: 138 mmol/L (ref 135–145)
Total Bilirubin: 2 mg/dL — ABNORMAL HIGH (ref 0.3–1.2)
Total Protein: 6.4 g/dL — ABNORMAL LOW (ref 6.5–8.1)

## 2021-10-28 LAB — CREATININE, URINE, RANDOM: Creatinine, Urine: 161.49 mg/dL

## 2021-10-28 LAB — IRON AND TIBC
Iron: 165 ug/dL (ref 28–170)
Saturation Ratios: 71 % — ABNORMAL HIGH (ref 10.4–31.8)
TIBC: 234 ug/dL — ABNORMAL LOW (ref 250–450)
UIBC: 69 ug/dL

## 2021-10-28 LAB — NA AND K (SODIUM & POTASSIUM), RAND UR
Potassium Urine: 23 mmol/L
Sodium, Ur: 61 mmol/L

## 2021-10-28 LAB — HEMOGLOBIN A1C
Hgb A1c MFr Bld: 4.6 % — ABNORMAL LOW (ref 4.8–5.6)
Mean Plasma Glucose: 85.32 mg/dL

## 2021-10-28 LAB — GLUCOSE, CAPILLARY
Glucose-Capillary: 101 mg/dL — ABNORMAL HIGH (ref 70–99)
Glucose-Capillary: 104 mg/dL — ABNORMAL HIGH (ref 70–99)

## 2021-10-28 LAB — RETICULOCYTES
Immature Retic Fract: 11.5 % (ref 2.3–15.9)
RBC.: 3.52 MIL/uL — ABNORMAL LOW (ref 3.87–5.11)
Retic Count, Absolute: 60.2 10*3/uL (ref 19.0–186.0)
Retic Ct Pct: 1.7 % (ref 0.4–3.1)

## 2021-10-28 LAB — FOLATE: Folate: 8.4 ng/mL (ref >5.9)

## 2021-10-28 LAB — VITAMIN B12: Vitamin B-12: 464 pg/mL (ref 180–914)

## 2021-10-28 LAB — LIPASE, BLOOD: Lipase: 181 U/L — ABNORMAL HIGH (ref 11–51)

## 2021-10-28 LAB — OSMOLALITY: Osmolality: 287 mOsm/kg (ref 275–295)

## 2021-10-28 LAB — MAGNESIUM
Magnesium: 1.6 mg/dL — ABNORMAL LOW (ref 1.7–2.4)
Magnesium: 2.4 mg/dL (ref 1.7–2.4)

## 2021-10-28 LAB — PHOSPHORUS
Phosphorus: 3.1 mg/dL (ref 2.5–4.6)
Phosphorus: 1.9 mg/dL — ABNORMAL LOW (ref 2.5–4.6)

## 2021-10-28 LAB — FERRITIN: Ferritin: 464 ng/mL — ABNORMAL HIGH (ref 11–307)

## 2021-10-28 LAB — OSMOLALITY, URINE: Osmolality, Ur: 298 mOsm/kg — ABNORMAL LOW (ref 300–900)

## 2021-10-28 MED ORDER — METOPROLOL TARTRATE 25 MG PO TABS: 12.5000 mg | ORAL_TABLET | Freq: Two times a day (BID) | ORAL | 1 refills | Status: AC

## 2021-10-28 MED ORDER — ONDANSETRON HCL 4 MG PO TABS: 4.0000 mg | ORAL_TABLET | Freq: Three times a day (TID) | ORAL | 0 refills | Status: AC | PRN

## 2021-10-28 MED ORDER — MAGNESIUM SULFATE 2 GM/50ML IV SOLN
2.0000 g | Freq: Once | INTRAVENOUS | Status: AC
  Administered 2021-10-28: 2 g via INTRAVENOUS
  Filled 2021-10-28: qty 50

## 2021-10-28 MED ORDER — ADULT MULTIVITAMIN W/MINERALS CH: 1.0000 | ORAL_TABLET | Freq: Every day | ORAL | 0 refills | Status: AC

## 2021-10-28 MED ORDER — MELATONIN 5 MG PO TABS
5.0000 mg | ORAL_TABLET | Freq: Once | ORAL | Status: AC
  Administered 2021-10-28: 5 mg via ORAL
  Filled 2021-10-28: qty 1

## 2021-10-28 MED ORDER — FOLIC ACID 1 MG PO TABS: 1.0000 mg | ORAL_TABLET | Freq: Every day | ORAL | 0 refills | Status: AC

## 2021-10-28 MED ORDER — OXYCODONE HCL 5 MG PO TABS: 5.0000 mg | ORAL_TABLET | Freq: Once | ORAL | Status: DC

## 2021-10-28 MED ORDER — METOPROLOL TARTRATE 25 MG PO TABS
12.5000 mg | ORAL_TABLET | Freq: Two times a day (BID) | ORAL | Status: DC
  Administered 2021-10-28 (×2): 12.5 mg via ORAL
  Filled 2021-10-28 (×2): qty 1

## 2021-10-28 MED ORDER — POTASSIUM CHLORIDE CRYS ER 20 MEQ PO TBCR
40.0000 meq | EXTENDED_RELEASE_TABLET | ORAL | Status: AC
  Administered 2021-10-28 (×2): 40 meq via ORAL
  Filled 2021-10-28 (×2): qty 2

## 2021-10-28 MED ORDER — PANTOPRAZOLE SODIUM 40 MG PO TBEC: 40.0000 mg | DELAYED_RELEASE_TABLET | Freq: Every day | ORAL | 1 refills | Status: AC

## 2021-10-28 MED ORDER — GADOBUTROL 1 MMOL/ML IV SOLN
7.0000 mL | Freq: Once | INTRAVENOUS | Status: AC | PRN
  Administered 2021-10-28: 7 mL via INTRAVENOUS

## 2021-10-28 MED ORDER — FENTANYL CITRATE PF 50 MCG/ML IJ SOSY
25.0000 ug | PREFILLED_SYRINGE | INTRAMUSCULAR | Status: DC | PRN
  Administered 2021-10-29: 25 ug via INTRAVENOUS
  Filled 2021-10-28: qty 1

## 2021-10-28 MED ORDER — THIAMINE HCL 100 MG PO TABS: 100.0000 mg | ORAL_TABLET | Freq: Every day | ORAL | 0 refills | Status: AC

## 2021-10-28 MED ORDER — OXYCODONE HCL 5 MG PO TABS
5.0000 mg | ORAL_TABLET | Freq: Four times a day (QID) | ORAL | Status: DC | PRN
  Administered 2021-10-28 – 2021-10-30 (×4): 5 mg via ORAL
  Filled 2021-10-28 (×4): qty 1

## 2021-10-28 MED ORDER — CHLORDIAZEPOXIDE HCL 10 MG PO CAPS: ORAL_CAPSULE | ORAL | 0 refills | Status: AC

## 2021-10-28 NOTE — Progress Notes (Signed)
PROGRESS NOTE  Leslie Tucker WUJ:[id number] DOB: [birth year]   PCP: Leslie Butter, NP  Patient is from: Home DOA: 10/27/2021 LOS: 1  Chief complaints:  Chief Complaint  Patient presents with   Abdominal Pain   Hematemesis     Brief Narrative / Interim history: 25 year old F with PMH of alcohol abuse, previous alcohol withdrawal seizure, HTN and SVT presenting with nausea and vomiting nausea, vomiting and epigastric pain followed by hematemesis, and admitted for the same.  She has mildly elevated LFTs, high anion gap metabolic acidosis and electrolyte derangements.  Her lipase is within normal.  CT abdomen and pelvis with low-density at the pancreaticoduodenal groove and marked hepatic steatosis.  She was also hypoglycemic requiring D5 infusion.  She was started on CIWA with scheduled and as needed Ativan.  MRCP ordered.  Subjective: Seen and examined earlier this morning and later this afternoon.  She denies further nausea and vomiting.  Still with epigastric pain.  Pain is on and off.  She reports history of alcohol withdrawal seizure in the past.  She denies chest pain, dyspnea, melena, hematochezia or UTI symptoms.  Objective: Vitals:   10/27/21 1942 10/27/21 2332 10/28/21 0347 10/28/21 1345  BP: (!) 147/105 (!) 137/106 (!) 128/101 (!) 148/111  Pulse: 97 90 88 95  Resp: 14 15 15 18   Temp: 99.4 F (37.4 C) 98.4 F (36.9 C) 98.3 F (36.8 C)   TempSrc: Oral Oral Oral   SpO2: 100% 100% 100% 100%  Weight:      Height:        Examination:  GENERAL: No apparent distress.  Nontoxic. HEENT: MMM.  Vision and hearing grossly intact.  NECK: Supple.  No apparent JVD.  RESP: 100% on RA.  No IWOB.  Fair aeration bilaterally. CVS:  RRR. Heart sounds normal.  ABD/GI/GU: BS+. Abd soft.  Tenderness over epigastric area.  No rebound or guarding. MSK/EXT:  Moves extremities. No apparent deformity. No edema.  SKIN: no apparent skin lesion or wound NEURO: Awake, alert and oriented  appropriately.  No apparent focal neuro deficit. PSYCH: Calm. Normal affect.   Procedures:  None  Microbiology summarized: COVID-19 and influenza PCR nonreactive.  Assessment & Plan: Nausea, vomiting, epigastric pain and hematemesis-no further nausea, vomiting or hematemesis.  Hematemesis likely Mallory-Weiss tear.  No history of melena or hematochezia.  Still with intermittent epigastric pain. -Continue IV Protonix and Carafate -Full liquid diet -Start p.o. oxycodone and IV fentanyl as needed for pain based on pain severity -IV fluid and antiemetics  Alcohol abuse/history of alcohol withdrawal seizure: Currently does not have significant withdrawal symptoms.  She had some tactile disturbance and headache earlier in the morning.  -Continue CIWA with as needed Ativan -Continue multivitamin, thiamine and folic acid  Elevated lipase-no radiologic evidence of acute pancreatitis but MRCP shows small pseudocyst -Cardiology recommended repeat MRI in 12 months. -Recheck lipase level in the morning  Hypokalemia/hypomagnesemia/hyponatremia: Likely from alcohol.  Resolved.  Elevated liver enzymes/hyperbilirubinemia/hepatic steatosis: Pattern consistent with alcohol.  Improving. -Continue monitoring -Recheck in the morning  SVT/uncontrolled hypertension-prior history of SVT.  Not on medication.  TSH within normal. -Start low-dose metoprolol  Macrocytosis: Likely due to alcohol and hepatic steatosis.  Hypoglycemia: No history of diabetes.  Not on insulin.  Doubt insulinoma -Continue D5/LR  Body mass index is 28.83 kg/m.         DVT prophylaxis:  Place and maintain sequential compression device Start: 10/27/21 1825 SCDs Start: 10/27/21 1759 SCDs Start: 10/27/21 0913  Code Status: Full code  Family Communication: Patient and/or RN. Available if any question.  Level of care: Telemetry Status is: Inpatient  Remains inpatient appropriate because: Acute pancreatitis, alcohol  withdrawal and abdominal pain       Consultants:  None   Sch Meds:  Scheduled Meds:  folic acid  1 mg Oral Daily   LORazepam  0-4 mg Intravenous Q6H   Followed by   Melene Muller ON 10/29/2021] LORazepam  0-4 mg Intravenous Q12H   metoprolol tartrate  12.5 mg Oral BID   multivitamin with minerals  1 tablet Oral Daily   pantoprazole (PROTONIX) IV  40 mg Intravenous Q12H   sucralfate  1 g Oral TID WC & HS   thiamine  100 mg Oral Daily   Or   thiamine  100 mg Intravenous Daily   Continuous Infusions:  dextrose 5% lactated ringers 100 mL/hr at 10/27/21 1908   PRN Meds:.acetaminophen **OR** acetaminophen, lip balm, LORazepam **OR** LORazepam, ondansetron **OR** ondansetron (ZOFRAN) IV, oxyCODONE  Antimicrobials: Anti-infectives (From admission, onward)    None        I have personally reviewed the following labs and images: CBC: Recent Labs  Lab 10/27/21 0301 10/27/21 0914 10/27/21 1039  WBC 4.7  --  4.1  NEUTROABS 2.5  --   --   HGB 14.8 13.3 12.5  HCT 44.0 39.0 37.0  MCV 105.3*  --  104.5*  PLT 223  --  175   BMP &GFR Recent Labs  Lab 10/27/21 0301 10/27/21 0651 10/27/21 0914 10/27/21 1039 10/27/21 1906 10/28/21 0800 10/28/21 1630  NA 134* 134* 132*  --  135  --  138  K 4.0 4.1 3.8  --  3.4*  --  4.3  CL 91* 97*  --   --  100  --  109  CO2 19* 17*  --   --  22  --  21*  GLUCOSE 69* 62*  --   --  104*  --  99  BUN 6 6  --   --  <5*  --  <5*  CREATININE 0.72 0.69  --  0.74 0.77  --  0.57  CALCIUM 10.3 8.6*  --   --  9.6  --  9.1  MG  --   --   --   --   --  1.6* 2.4  PHOS  --   --   --   --   --  3.1 1.9*   Estimated Creatinine Clearance: 98.8 mL/min (by C-G formula based on SCr of 0.57 mg/dL). Liver & Pancreas: Recent Labs  Lab 10/27/21 0301 10/27/21 0651 10/27/21 1906 10/28/21 1630  AST 264* 167* 148* 158*  ALT 130* 95* 104* 91*  ALKPHOS 70 60 68 51  BILITOT 2.0* 1.6* 2.5* 2.0*  PROT 8.6* 6.8 8.4* 6.4*  ALBUMIN 5.0 4.0 4.8 3.6   Recent Labs   Lab 10/27/21 0301 10/28/21 1630  LIPASE 41 181*   No results for input(s): AMMONIA in the last 168 hours. Diabetic: No results for input(s): HGBA1C in the last 72 hours. Recent Labs  Lab 10/28/21 0754  GLUCAP 101*   Cardiac Enzymes: No results for input(s): CKTOTAL, CKMB, CKMBINDEX, TROPONINI in the last 168 hours. No results for input(s): PROBNP in the last 8760 hours. Coagulation Profile: Recent Labs  Lab 10/27/21 0520  INR 1.1   Thyroid Function Tests: No results for input(s): TSH, T4TOTAL, FREET4, T3FREE, THYROIDAB in the last 72 hours. Lipid Profile: No results for input(s): CHOL, HDL, LDLCALC, TRIG, CHOLHDL,  LDLDIRECT in the last 72 hours. Anemia Panel: Recent Labs    10/28/21 0800  VITAMINB12 464  FOLATE 8.4  FERRITIN 464*  TIBC 234*  IRON 165  RETICCTPCT 1.7   Urine analysis:    Component Value Date/Time   COLORURINE YELLOW 10/27/2021 0301   APPEARANCEUR CLEAR 10/27/2021 0301   LABSPEC 1.027 10/27/2021 0301   PHURINE 5.5 10/27/2021 0301   GLUCOSEU NEGATIVE 10/27/2021 0301   HGBUR NEGATIVE 10/27/2021 0301   BILIRUBINUR NEGATIVE 10/27/2021 0301   BILIRUBINUR negative 06/12/2020 0920   KETONESUR >80 (A) 10/27/2021 0301   PROTEINUR 30 (A) 10/27/2021 0301   UROBILINOGEN 0.2 06/12/2020 0920   NITRITE NEGATIVE 10/27/2021 0301   LEUKOCYTESUR NEGATIVE 10/27/2021 0301   Sepsis Labs: Invalid input(s): PROCALCITONIN, LACTICIDVEN  Microbiology: Recent Results (from the past 240 hour(s))  Resp Panel by RT-PCR (Flu A&B, Covid) Nasopharyngeal Swab     Status: None   Collection Time: 10/27/21  7:49 AM   Specimen: Nasopharyngeal Swab; Nasopharyngeal(NP) swabs in vial transport medium  Result Value Ref Range Status   SARS Coronavirus 2 by RT PCR NEGATIVE NEGATIVE Final    Comment: (NOTE) SARS-CoV-2 target nucleic acids are NOT DETECTED.  The SARS-CoV-2 RNA is generally detectable in upper respiratory specimens during the acute phase of infection. The  lowest concentration of SARS-CoV-2 viral copies this assay can detect is 138 copies/mL. A negative result does not preclude SARS-Cov-2 infection and should not be used as the sole basis for treatment or other patient management decisions. A negative result may occur with  improper specimen collection/handling, submission of specimen other than nasopharyngeal swab, presence of viral mutation(s) within the areas targeted by this assay, and inadequate number of viral copies(<138 copies/mL). A negative result must be combined with clinical observations, patient history, and epidemiological information. The expected result is Negative.  Fact Sheet for Patients:  BloggerCourse.com  Fact Sheet for Healthcare Providers:  SeriousBroker.it  This test is no t yet approved or cleared by the [place] FDA and  has been authorized for detection and/or diagnosis of SARS-CoV-2 by FDA under an Emergency Use Authorization (EUA). This EUA will remain  in effect (meaning this test can be used) for the duration of the COVID-19 declaration under Section 564(b)(1) of the Act, 21 U.S.C.section 360bbb-3(b)(1), unless the authorization is terminated  or revoked sooner.       Influenza A by PCR NEGATIVE NEGATIVE Final   Influenza B by PCR NEGATIVE NEGATIVE Final    Comment: (NOTE) The Xpert Xpress SARS-CoV-2/FLU/RSV plus assay is intended as an aid in the diagnosis of influenza from Nasopharyngeal swab specimens and should not be used as a sole basis for treatment. Nasal washings and aspirates are unacceptable for Xpert Xpress SARS-CoV-2/FLU/RSV testing.  Fact Sheet for Patients: BloggerCourse.com  Fact Sheet for Healthcare Providers: SeriousBroker.it  This test is not yet approved or cleared by the [place] FDA and has been authorized for detection and/or diagnosis of SARS-CoV-2 by FDA under  an Emergency Use Authorization (EUA). This EUA will remain in effect (meaning this test can be used) for the duration of the COVID-19 declaration under Section 564(b)(1) of the Act, 21 U.S.C. section 360bbb-3(b)(1), unless the authorization is terminated or revoked.  Performed at [facility], [address] [place], [place] [address]     Radiology Studies: MR ABDOMEN W WO CONTRAST  Result Date: 10/28/2021 CLINICAL DATA:  25 year old female with history of possible mass in the pancreas on recent CT examination. EXAM: MRI ABDOMEN  WITHOUT AND WITH CONTRAST TECHNIQUE: Multiplanar multisequence MR imaging of the abdomen was performed both before and after the administration of intravenous contrast. CONTRAST:  71mL GADAVIST GADOBUTROL 1 MMOL/ML IV SOLN COMPARISON:  No prior abdominal MRI. CT the abdomen and pelvis 10/27/2021. FINDINGS: Lower chest: Unremarkable. Hepatobiliary: Diffuse loss of signal intensity throughout the hepatic parenchyma, indicative of hepatic steatosis. No suspicious cystic or solid hepatic lesions. No intra or extrahepatic biliary ductal dilatation noted on MRCP images. Amorphous high signal intensity throughout the gallbladder on T1 weighted images, indicative of biliary sludge. Gallbladder is moderately distended. Gallbladder wall thickness is normal. No pericholecystic fluid. 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 No other solid pancreatic mass. No pancreatic ductal dilatation noted on MRCP images. Spleen:  Unremarkable. Adrenals/Urinary Tract: Bilateral kidneys and bilateral adrenal glands are normal in appearance. No hydroureteronephrosis in the visualized portions of the abdomen. Stomach/Bowel: Visualized portions are unremarkable.  Vascular/Lymphatic: No aneurysm identified in the visualized abdominal vasculature. No lymphadenopathy noted in the abdomen. Other: No significant volume of ascites noted in the visualized portions of the peritoneal cavity. Musculoskeletal: No aggressive appearing osseous lesions are noted in the visualized portions of the skeleton. IMPRESSION: 1. 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 This currently measures 1.2 x 1.0 x 1.3 cm. No definite communication with the main pancreatic duct. Repeat abdominal MRI with and without IV gadolinium with MRCP is recommended in 1 year. This recommendation follows ACR consensus guidelines: Management of Incidental Pancreatic Cysts: A White Paper of the ACR Incidental Findings Committee. J Am Coll Radiol 2017;14:911-923. 2. Severe hepatic steatosis. Electronically Signed   By: Trudie Reed M.D.   On: 10/28/2021 13:48      Sarkis Rhines T. Manfred Laspina [facility]  If 7PM-7AM, please contact night-coverage [email or web address] 10/28/2021, 5:26 PM

## 2021-10-28 NOTE — Progress Notes (Signed)
Notified Dr. Alanda Slim of patient's elevated heart rate up to 120s-140s with activity, order written for low does metoprolol. Will continue to assess patient.

## 2021-10-29 DIAGNOSIS — E871 Hypo-osmolality and hyponatremia: Secondary | ICD-10-CM | POA: Diagnosis not present

## 2021-10-29 DIAGNOSIS — E876 Hypokalemia: Secondary | ICD-10-CM | POA: Diagnosis not present

## 2021-10-29 DIAGNOSIS — R000 Tachycardia, unspecified: Secondary | ICD-10-CM | POA: Diagnosis not present

## 2021-10-29 DIAGNOSIS — E8729 Other acidosis: Secondary | ICD-10-CM | POA: Diagnosis not present

## 2021-10-29 DIAGNOSIS — K852 Alcohol induced acute pancreatitis without necrosis or infection: Secondary | ICD-10-CM | POA: Diagnosis not present

## 2021-10-29 DIAGNOSIS — K760 Fatty (change of) liver, not elsewhere classified: Secondary | ICD-10-CM | POA: Diagnosis not present

## 2021-10-29 DIAGNOSIS — R7401 Elevation of levels of liver transaminase levels: Secondary | ICD-10-CM | POA: Diagnosis not present

## 2021-10-29 DIAGNOSIS — Z419 Encounter for procedure for purposes other than remedying health state, unspecified: Secondary | ICD-10-CM | POA: Diagnosis not present

## 2021-10-29 DIAGNOSIS — K863 Pseudocyst of pancreas: Secondary | ICD-10-CM | POA: Diagnosis not present

## 2021-10-29 LAB — PHOSPHORUS: Phosphorus: 2.6 mg/dL (ref 2.5–4.6)

## 2021-10-29 LAB — COMPREHENSIVE METABOLIC PANEL
ALT: 81 U/L — ABNORMAL HIGH (ref 0–44)
AST: 96 U/L — ABNORMAL HIGH (ref 15–41)
Albumin: 3.9 g/dL (ref 3.5–5.0)
Alkaline Phosphatase: 55 U/L (ref 38–126)
Anion gap: 3 — ABNORMAL LOW (ref 5–15)
BUN: <5 mg/dL — ABNORMAL LOW (ref 6–20)
CO2: 25 mmol/L (ref 22–32)
Calcium: 9 mg/dL (ref 8.9–10.3)
Chloride: 107 mmol/L (ref 98–111)
Creatinine, Ser: 0.7 mg/dL (ref 0.44–1.00)
GFR, Estimated: >60 mL/min (ref >60)
Glucose, Bld: 117 mg/dL — ABNORMAL HIGH (ref 70–99)
Potassium: 3.6 mmol/L (ref 3.5–5.1)
Sodium: 135 mmol/L (ref 135–145)
Total Bilirubin: 1.6 mg/dL — ABNORMAL HIGH (ref 0.3–1.2)
Total Protein: 6.5 g/dL (ref 6.5–8.1)

## 2021-10-29 LAB — GLUCOSE, CAPILLARY
Glucose-Capillary: 115 mg/dL — ABNORMAL HIGH (ref 70–99)
Glucose-Capillary: 117 mg/dL — ABNORMAL HIGH (ref 70–99)
Glucose-Capillary: 200 mg/dL — ABNORMAL HIGH (ref 70–99)

## 2021-10-29 LAB — CBC
HCT: 40.6 % (ref 36.0–46.0)
Hemoglobin: 13.6 g/dL (ref 12.0–15.0)
MCH: 36.4 pg — ABNORMAL HIGH (ref 26.0–34.0)
MCHC: 33.5 g/dL (ref 30.0–36.0)
MCV: 108.6 fL — ABNORMAL HIGH (ref 80.0–100.0)
Platelets: 144 10*3/uL — ABNORMAL LOW (ref 150–400)
RBC: 3.74 MIL/uL — ABNORMAL LOW (ref 3.87–5.11)
RDW: 13.2 % (ref 11.5–15.5)
WBC: 4.2 10*3/uL (ref 4.0–10.5)
nRBC: 0 % (ref 0.0–0.2)

## 2021-10-29 LAB — LIPASE, BLOOD: Lipase: 668 U/L — ABNORMAL HIGH (ref 11–51)

## 2021-10-29 LAB — MAGNESIUM: Magnesium: 1.8 mg/dL (ref 1.7–2.4)

## 2021-10-29 MED ORDER — LACTATED RINGERS IV SOLN: INTRAVENOUS | Status: DC

## 2021-10-29 MED ORDER — CARVEDILOL 6.25 MG PO TABS
6.2500 mg | ORAL_TABLET | Freq: Two times a day (BID) | ORAL | Status: DC
  Administered 2021-10-29 – 2021-10-30 (×3): 6.25 mg via ORAL
  Filled 2021-10-29 (×3): qty 1

## 2021-10-29 MED ORDER — HYDROMORPHONE HCL 1 MG/ML IJ SOLN
0.5000 mg | INTRAMUSCULAR | Status: DC | PRN
Start: 2021-10-29 — End: 2021-10-30
  Administered 2021-10-29 – 2021-10-30 (×5): 0.5 mg via INTRAVENOUS
  Filled 2021-10-29 (×5): qty 0.5

## 2021-10-29 NOTE — Progress Notes (Signed)
PROGRESS NOTE  Leslie Tucker [id number] DOB: [birth year]   PCP: Samuel Bouche, NP  Patient is from: Home DOA: 10/27/2021 LOS: 2  Chief complaints:  Chief Complaint  Patient presents with   Abdominal Pain   Hematemesis     Brief Narrative / Interim history: 26 year old F with PMH of alcohol abuse, previous alcohol withdrawal seizure, HTN and SVT presenting with nausea and vomiting nausea, vomiting and epigastric pain followed by hematemesis, and admitted for the same.  She has mildly elevated LFTs, high anion gap metabolic acidosis and electrolyte derangements.  Her lipase is within normal.  CT abdomen and pelvis with low-density at the pancreaticoduodenal groove and marked hepatic steatosis.  She was also hypoglycemic requiring D5 infusion.  She was started on CIWA with scheduled and as needed Ativan.  MRCP ordered.  MRCP showed small pseudocyst and marked hepatic steatosis.  Lipase trended up to 668.  Subjective: Seen and examined earlier this morning.  She had 8/10 pain across upper abdomen last night but improved to 6/10 this morning after pain medications.  Not able to describe the pain.  No radiation to her back or his shoulders.  Some nausea but no emesis.  Denies chest pain or dyspnea.  Objective: Vitals:   10/28/21 1345 10/28/21 1800 10/28/21 1952 10/29/21 0357  BP: (!) 148/111 (!) 143/112 (!) 138/104 (!) 136/100  Pulse: 95 97 93 97  Resp: 18 20 17 18   Temp:  98.7 F (37.1 C) (!) 97.5 F (36.4 C) 97.7 F (36.5 C)  TempSrc:  Oral Oral Oral  SpO2: 100% 100% 100% 100%  Weight:      Height:        Examination:  GENERAL: No apparent distress.  Nontoxic. HEENT: MMM.  Vision and hearing grossly intact.  NECK: Supple.  No apparent JVD.  RESP: 100% on RA.  No IWOB.  Fair aeration bilaterally. CVS:  RRR. Heart sounds normal.  ABD/GI/GU: BS+. Abd soft.  TTP across upper abdomen.  No rebound or guarding. MSK/EXT:  Moves extremities. No apparent deformity. No edema.   SKIN: no apparent skin lesion or wound NEURO: Awake and alert. Oriented appropriately.  No apparent focal neuro deficit. PSYCH: Calm. Normal affect.   Procedures:  None  Microbiology summarized: [id number] and influenza PCR nonreactive.  Assessment & Plan: Acute alcoholic pancreatitis-lipase trended from normal to 668.  Pain seems to be progressively worse.  No radiologic evidence of pancreatitis on CT or MRI but a small pseudocyst and marked hepatic steatosis. -De-escalate diet to clear liquid -Pain control with as needed oxycodone and Dilaudid -Increase IV fluids to 125 cc an hour -Continue antiemetics -Consider repeat imaging and GI consult if no improvement or worse.  Nausea, vomiting, epigastric pain and hematemesis-no further nausea, vomiting or hematemesis.  Hematemesis likely Mallory-Weiss tear.  No history of melena or hematochezia.   -Continue IV Protonix and Carafate -Clear liquid diet as above. -IV fluid, analgesics and antiemetics as above  Alcohol abuse/history of alcohol withdrawal seizure: Currently does not have significant withdrawal symptoms.  She had some tactile disturbance and headache earlier in the morning.  -Continue CIWA with as needed Ativan -Continue multivitamin, thiamine and folic acid  Elevated liver enzymes/hyperbilirubinemia/hepatic steatosis: Pattern consistent with alcohol.  Improving. -Continue monitoring -Recheck in the morning  Hypokalemia/hypomagnesemia/hyponatremia: Likely from alcohol.  Resolved.  SVT/uncontrolled hypertension-prior history of SVT.  Not on medication.  TSH within normal. -Changed metoprolol to Coreg for blood pressure control  Macrocytosis: Likely due to alcohol and hepatic steatosis.  Hypoglycemia:  No history of diabetes.  A1c 4.6%.  Not on insulin.  Resolved. -Continue D5/LR  Body mass index is 28.83 kg/m.         DVT prophylaxis:  Place and maintain sequential compression device Start: 10/27/21 1825 SCDs  Start: 10/27/21 1759 SCDs Start: 10/27/21 0913  Code Status: Full code Family Communication: Patient and/or RN. Available if any question.  Level of care: Telemetry Status is: Inpatient  Remains inpatient appropriate because: Acute pancreatitis, alcohol withdrawal and abdominal pain  Final disposition: Home when medically stable   Consultants:  None   Sch Meds:  Scheduled Meds:  carvedilol  6.25 mg Oral BID WC   folic acid  1 mg Oral Daily   LORazepam  0-4 mg Intravenous Q12H   multivitamin with minerals  1 tablet Oral Daily   pantoprazole (PROTONIX) IV  40 mg Intravenous Q12H   sucralfate  1 g Oral TID WC & HS   thiamine  100 mg Oral Daily   Or   thiamine  100 mg Intravenous Daily   Continuous Infusions:  lactated ringers 125 mL/hr at 10/29/21 0817   PRN Meds:.acetaminophen **OR** acetaminophen, HYDROmorphone (DILAUDID) injection, lip balm, LORazepam **OR** LORazepam, ondansetron **OR** ondansetron (ZOFRAN) IV, oxyCODONE  Antimicrobials: Anti-infectives (From admission, onward)    None        I have personally reviewed the following labs and images: CBC: Recent Labs  Lab 10/27/21 0301 10/27/21 0914 10/27/21 1039 10/29/21 0800  WBC 4.7  --  4.1 4.2  NEUTROABS 2.5  --   --   --   HGB 14.8 13.3 12.5 13.6  HCT 44.0 39.0 37.0 40.6  MCV 105.3*  --  104.5* 108.6*  PLT 223  --  175 144*   BMP &GFR Recent Labs  Lab 10/27/21 0301 10/27/21 0651 10/27/21 0914 10/27/21 1039 10/27/21 1906 10/28/21 0800 10/28/21 1630 10/29/21 0800  NA 134* 134* 132*  --  135  --  138 135  K 4.0 4.1 3.8  --  3.4*  --  4.3 3.6  CL 91* 97*  --   --  100  --  109 107  CO2 19* 17*  --   --  22  --  21* 25  GLUCOSE 69* 62*  --   --  104*  --  99 117*  BUN 6 6  --   --  <5*  --  <5* <5*  CREATININE 0.72 0.69  --  0.74 0.77  --  0.57 0.70  CALCIUM 10.3 8.6*  --   --  9.6  --  9.1 9.0  MG  --   --   --   --   --  1.6* 2.4 1.8  PHOS  --   --   --   --   --  3.1 1.9* 2.6    Estimated Creatinine Clearance: 98.8 mL/min (by C-G formula based on SCr of 0.7 mg/dL). Liver & Pancreas: Recent Labs  Lab 10/27/21 0301 10/27/21 0651 10/27/21 1906 10/28/21 1630 10/29/21 0800  AST 264* 167* 148* 158* 96*  ALT 130* 95* 104* 91* 81*  ALKPHOS 70 60 68 51 55  BILITOT 2.0* 1.6* 2.5* 2.0* 1.6*  PROT 8.6* 6.8 8.4* 6.4* 6.5  ALBUMIN 5.0 4.0 4.8 3.6 3.9   Recent Labs  Lab 10/27/21 0301 10/28/21 1630 10/29/21 0800  LIPASE 41 181* 668*   No results for input(s): AMMONIA in the last 168 hours. Diabetic: Recent Labs    10/28/21 0800  HGBA1C 4.6*  Recent Labs  Lab 10/28/21 0754 10/28/21 1748 10/29/21 0046 10/29/21 0805  GLUCAP 101* 104* 200* 117*   Cardiac Enzymes: No results for input(s): CKTOTAL, CKMB, CKMBINDEX, TROPONINI in the last 168 hours. No results for input(s): PROBNP in the last 8760 hours. Coagulation Profile: Recent Labs  Lab 10/27/21 0520  INR 1.1   Thyroid Function Tests: No results for input(s): TSH, T4TOTAL, FREET4, T3FREE, THYROIDAB in the last 72 hours. Lipid Profile: No results for input(s): CHOL, HDL, LDLCALC, TRIG, CHOLHDL, LDLDIRECT in the last 72 hours. Anemia Panel: Recent Labs    10/28/21 0800  VITAMINB12 464  FOLATE 8.4  FERRITIN 464*  TIBC 234*  IRON 165  RETICCTPCT 1.7   Urine analysis:    Component Value Date/Time   COLORURINE YELLOW 10/27/2021 0301   APPEARANCEUR CLEAR 10/27/2021 0301   LABSPEC 1.027 10/27/2021 0301   PHURINE 5.5 10/27/2021 0301   GLUCOSEU NEGATIVE 10/27/2021 0301   HGBUR NEGATIVE 10/27/2021 0301   BILIRUBINUR NEGATIVE 10/27/2021 0301   BILIRUBINUR negative 06/12/2020 0920   KETONESUR >80 (A) 10/27/2021 0301   PROTEINUR 30 (A) 10/27/2021 0301   UROBILINOGEN 0.2 06/12/2020 0920   NITRITE NEGATIVE 10/27/2021 0301   LEUKOCYTESUR NEGATIVE 10/27/2021 0301   Sepsis Labs: Invalid input(s): PROCALCITONIN, LACTICIDVEN  Microbiology: Recent Results (from the past 240 hour(s))  Resp  Panel by RT-PCR (Flu A&B, Covid) Nasopharyngeal Swab     Status: None   Collection Time: 10/27/21  7:49 AM   Specimen: Nasopharyngeal Swab; Nasopharyngeal(NP) swabs in vial transport medium  Result Value Ref Range Status   SARS Coronavirus 2 by RT PCR NEGATIVE NEGATIVE Final    Comment: (NOTE) SARS-CoV-2 target nucleic acids are NOT DETECTED.  The SARS-CoV-2 RNA is generally detectable in upper respiratory specimens during the acute phase of infection. The lowest concentration of SARS-CoV-2 viral copies this assay can detect is 138 copies/mL. A negative result does not preclude SARS-Cov-2 infection and should not be used as the sole basis for treatment or other patient management decisions. A negative result may occur with  improper specimen collection/handling, submission of specimen other than nasopharyngeal swab, presence of viral mutation(s) within the areas targeted by this assay, and inadequate number of viral copies(<138 copies/mL). A negative result must be combined with clinical observations, patient history, and epidemiological information. The expected result is Negative.  Fact Sheet for Patients:  BloggerCourse.com  Fact Sheet for Healthcare Providers:  SeriousBroker.it  This test is no t yet approved or cleared by the [place] FDA and  has been authorized for detection and/or diagnosis of SARS-CoV-2 by FDA under an Emergency Use Authorization (EUA). This EUA will remain  in effect (meaning this test can be used) for the duration of the COVID-19 declaration under Section 564(b)(1) of the Act, 21 U.S.C.section 360bbb-3(b)(1), unless the authorization is terminated  or revoked sooner.       Influenza A by PCR NEGATIVE NEGATIVE Final   Influenza B by PCR NEGATIVE NEGATIVE Final    Comment: (NOTE) The Xpert Xpress SARS-CoV-2/FLU/RSV plus assay is intended as an aid in the diagnosis of influenza from Nasopharyngeal  swab specimens and should not be used as a sole basis for treatment. Nasal washings and aspirates are unacceptable for Xpert Xpress SARS-CoV-2/FLU/RSV testing.  Fact Sheet for Patients: BloggerCourse.com  Fact Sheet for Healthcare Providers: SeriousBroker.it  This test is not yet approved or cleared by the [place] FDA and has been authorized for detection and/or diagnosis of SARS-CoV-2 by FDA under an Emergency Use  Authorization (EUA). This EUA will remain in effect (meaning this test can be used) for the duration of the COVID-19 declaration under Section 564(b)(1) of the Act, 21 U.S.C. section 360bbb-3(b)(1), unless the authorization is terminated or revoked.  Performed at [facility], [address] [place], [place] [address]     Radiology Studies: No results found.    Eran Windish T. Pitkin  If 7PM-7AM, please contact night-coverage [email or web address] 10/29/2021, 1:22 PM

## 2021-10-29 NOTE — Plan of Care (Signed)

## 2021-10-29 NOTE — Progress Notes (Signed)
Pts BP was 134/102. MD notified.

## 2021-10-30 ENCOUNTER — Inpatient Hospital Stay (HOSPITAL_COMMUNITY): Payer: Medicaid Other

## 2021-10-30 DIAGNOSIS — R000 Tachycardia, unspecified: Secondary | ICD-10-CM | POA: Diagnosis not present

## 2021-10-30 DIAGNOSIS — K760 Fatty (change of) liver, not elsewhere classified: Secondary | ICD-10-CM | POA: Diagnosis not present

## 2021-10-30 DIAGNOSIS — T782XXA Anaphylactic shock, unspecified, initial encounter: Secondary | ICD-10-CM | POA: Diagnosis not present

## 2021-10-30 DIAGNOSIS — K828 Other specified diseases of gallbladder: Secondary | ICD-10-CM | POA: Diagnosis not present

## 2021-10-30 DIAGNOSIS — R112 Nausea with vomiting, unspecified: Secondary | ICD-10-CM | POA: Diagnosis not present

## 2021-10-30 DIAGNOSIS — R7401 Elevation of levels of liver transaminase levels: Secondary | ICD-10-CM | POA: Diagnosis not present

## 2021-10-30 DIAGNOSIS — E876 Hypokalemia: Secondary | ICD-10-CM | POA: Diagnosis not present

## 2021-10-30 DIAGNOSIS — E871 Hypo-osmolality and hyponatremia: Secondary | ICD-10-CM | POA: Diagnosis not present

## 2021-10-30 DIAGNOSIS — K859 Acute pancreatitis without necrosis or infection, unspecified: Secondary | ICD-10-CM | POA: Diagnosis not present

## 2021-10-30 DIAGNOSIS — K838 Other specified diseases of biliary tract: Secondary | ICD-10-CM | POA: Diagnosis not present

## 2021-10-30 LAB — COMPREHENSIVE METABOLIC PANEL
ALT: 65 U/L — ABNORMAL HIGH (ref 0–44)
AST: 66 U/L — ABNORMAL HIGH (ref 15–41)
Albumin: 3.3 g/dL — ABNORMAL LOW (ref 3.5–5.0)
Alkaline Phosphatase: 50 U/L (ref 38–126)
Anion gap: 6 (ref 5–15)
BUN: <5 mg/dL — ABNORMAL LOW (ref 6–20)
CO2: 27 mmol/L (ref 22–32)
Calcium: 9.1 mg/dL (ref 8.9–10.3)
Chloride: 103 mmol/L (ref 98–111)
Creatinine, Ser: 0.75 mg/dL (ref 0.44–1.00)
GFR, Estimated: >60 mL/min (ref >60)
Glucose, Bld: 94 mg/dL (ref 70–99)
Potassium: 3.6 mmol/L (ref 3.5–5.1)
Sodium: 136 mmol/L (ref 135–145)
Total Bilirubin: 1.4 mg/dL — ABNORMAL HIGH (ref 0.3–1.2)
Total Protein: 6 g/dL — ABNORMAL LOW (ref 6.5–8.1)

## 2021-10-30 LAB — MAGNESIUM: Magnesium: 1.4 mg/dL — ABNORMAL LOW (ref 1.7–2.4)

## 2021-10-30 LAB — CBC
HCT: 38.4 % (ref 36.0–46.0)
Hemoglobin: 12.8 g/dL (ref 12.0–15.0)
MCH: 36.1 pg — ABNORMAL HIGH (ref 26.0–34.0)
MCHC: 33.3 g/dL (ref 30.0–36.0)
MCV: 108.2 fL — ABNORMAL HIGH (ref 80.0–100.0)
Platelets: 137 10*3/uL — ABNORMAL LOW (ref 150–400)
RBC: 3.55 MIL/uL — ABNORMAL LOW (ref 3.87–5.11)
RDW: 13 % (ref 11.5–15.5)
WBC: 5.9 10*3/uL (ref 4.0–10.5)
nRBC: 0 % (ref 0.0–0.2)

## 2021-10-30 LAB — GLUCOSE, CAPILLARY
Glucose-Capillary: 105 mg/dL — ABNORMAL HIGH (ref 70–99)
Glucose-Capillary: 84 mg/dL (ref 70–99)
Glucose-Capillary: 90 mg/dL (ref 70–99)

## 2021-10-30 LAB — PHOSPHORUS: Phosphorus: 2.6 mg/dL (ref 2.5–4.6)

## 2021-10-30 LAB — LIPASE, BLOOD: Lipase: 540 U/L — ABNORMAL HIGH (ref 11–51)

## 2021-10-30 MED ORDER — MORPHINE SULFATE (PF) 2 MG/ML IV SOLN
1.0000 mg | INTRAVENOUS | Status: DC | PRN
  Administered 2021-10-30 (×2): 1 mg via INTRAVENOUS
  Filled 2021-10-30 (×2): qty 1

## 2021-10-30 MED ORDER — METHYLPREDNISOLONE SODIUM SUCC 125 MG IJ SOLR
60.0000 mg | Freq: Three times a day (TID) | INTRAMUSCULAR | Status: DC
  Administered 2021-10-30 – 2021-10-31 (×3): 60 mg via INTRAVENOUS
  Filled 2021-10-30 (×3): qty 2

## 2021-10-30 MED ORDER — THIAMINE HCL 100 MG PO TABS: 100.0000 mg | ORAL_TABLET | Freq: Every day | ORAL | Status: DC

## 2021-10-30 MED ORDER — POTASSIUM CHLORIDE CRYS ER 20 MEQ PO TBCR
40.0000 meq | EXTENDED_RELEASE_TABLET | Freq: Once | ORAL | Status: AC
  Administered 2021-10-30: 40 meq via ORAL
  Filled 2021-10-30: qty 2

## 2021-10-30 MED ORDER — DIPHENHYDRAMINE HCL 50 MG/ML IJ SOLN
25.0000 mg | Freq: Three times a day (TID) | INTRAMUSCULAR | Status: DC
  Administered 2021-10-30 – 2021-10-31 (×4): 25 mg via INTRAVENOUS
  Filled 2021-10-30 (×4): qty 1

## 2021-10-30 MED ORDER — ONDANSETRON 4 MG PO TBDP: 4.0000 mg | ORAL_TABLET | Freq: Four times a day (QID) | ORAL | Status: DC | PRN

## 2021-10-30 MED ORDER — THIAMINE HCL 100 MG/ML IJ SOLN: 100.0000 mg | Freq: Once | INTRAMUSCULAR | Status: DC

## 2021-10-30 MED ORDER — MAGNESIUM SULFATE 4 GM/100ML IV SOLN
4.0000 g | Freq: Once | INTRAVENOUS | Status: AC
  Administered 2021-10-30: 4 g via INTRAVENOUS
  Filled 2021-10-30: qty 100

## 2021-10-30 MED ORDER — LOPERAMIDE HCL 2 MG PO CAPS: 2.0000 mg | ORAL_CAPSULE | ORAL | Status: DC | PRN

## 2021-10-30 MED ORDER — ADULT MULTIVITAMIN W/MINERALS CH: 1.0000 | ORAL_TABLET | Freq: Every day | ORAL | Status: DC

## 2021-10-30 MED ORDER — MELATONIN 3 MG PO TABS
3.0000 mg | ORAL_TABLET | Freq: Every day | ORAL | Status: DC
  Administered 2021-10-30: 3 mg via ORAL
  Filled 2021-10-30: qty 1

## 2021-10-30 MED ORDER — SODIUM CHLORIDE 0.9 % IV SOLN
40.0000 mg | Freq: Two times a day (BID) | INTRAVENOUS | Status: DC
  Administered 2021-10-30 – 2021-10-31 (×3): 40 mg via INTRAVENOUS
  Filled 2021-10-30 (×5): qty 4

## 2021-10-30 MED ORDER — HYDROXYZINE HCL 25 MG PO TABS: 25.0000 mg | ORAL_TABLET | Freq: Four times a day (QID) | ORAL | Status: DC | PRN

## 2021-10-30 MED ORDER — METOPROLOL TARTRATE 5 MG/5ML IV SOLN: 5.0000 mg | INTRAVENOUS | Status: DC | PRN

## 2021-10-30 MED ORDER — FUROSEMIDE 10 MG/ML IJ SOLN
20.0000 mg | Freq: Once | INTRAMUSCULAR | Status: AC
  Administered 2021-10-30: 20 mg via INTRAVENOUS
  Filled 2021-10-30: qty 2

## 2021-10-30 MED ORDER — CHLORDIAZEPOXIDE HCL 25 MG PO CAPS: 25.0000 mg | ORAL_CAPSULE | Freq: Four times a day (QID) | ORAL | Status: DC | PRN

## 2021-10-30 NOTE — Progress Notes (Addendum)
PROGRESS NOTE    Leslie Tucker  [id number] DOB: [birth year] DOA: 10/27/2021 PCP: Samuel Bouche, NP    Chief Complaint  Patient presents with   Abdominal Pain   Hematemesis    Brief Narrative:  26 year old F with PMH of alcohol abuse, previous alcohol withdrawal seizure, HTN and SVT presenting with nausea and vomiting nausea, vomiting and epigastric pain followed by hematemesis, and admitted for the same.  She has mildly elevated LFTs, high anion gap metabolic acidosis and electrolyte derangements.  Her lipase is within normal.  CT abdomen and pelvis with low-density at the pancreaticoduodenal groove and marked hepatic steatosis.  She was also hypoglycemic requiring D5 infusion.  She was started on CIWA with scheduled and as needed Ativan.  MRCP ordered.   MRCP showed small pseudocyst and marked hepatic steatosis.  Lipase trended up to 668. Patient being treated for acute pancreatitis. On 10/30/2021 patient noted to have an anaphylactoid reaction.   Assessment & Plan:   Principal Problem:   Hematemesis Active Problems:   Anaphylactoid reaction   Acute pancreatitis   Sinus tachycardia   Tachypnea   Hyponatremia   Hypokalemia   High anion gap metabolic acidosis   Transaminitis   Alcohol abuse   Pancreatic lesion  #1 acute alcoholic pancreatitis versus gallstone pancreatitis -Patient noted to have lipase that trended from normal and elevated to 668. -Patient with worsening progressive abdominal pain early on during the hospitalization currently controlled on IV pain medication. -CT abdomen and pelvis done with low density at the pancreaticoduodenal groove indeterminate, marked hepatic steatosis. -MRCP with concern for pancreatic pseudocyst. -Patient still with upper abdominal pain and on examination more epigastric and right upper quadrant. -Lipase level at 540 from 668 from 181 from 41. -LFTs trending down. -Placed on bowel rest. -Continue IV fluids, IV antiemetics,  change Dilaudid to IV morphine due to concern for anaphylactoid reaction. -Check an abdominal ultrasound to rule out gallstone pancreatitis.. -Supportive care.  2.  Anaphylactoid reaction -Patient noted to have anaphylactoid reaction this morning with hives, itching, facial swelling, some arm swelling. -Patient just received a dose of IV magnesium which I doubt is causing the reaction. -Patient noted to only be on clear liquids. -Patient noted to also have recently received some Dilaudid and started on oral Coreg yesterday. -Discontinue Dilaudid, discontinue Coreg. -Placed on IV Solu-Medrol 60 mg every 8 hours, IV Benadryl 25 mg IV every 8 hours, Pepcid 40 mg IV every 12 hours. -We will likely need outpatient follow-up and will likely need to see an allergist in the outpatient setting. -Supportive care.  3.  Hypomagnesemia -Magnesium sulfate 4 g IV x1. -Repeat labs in the morning.  4.  Nausea vomiting hematemesis/epigastric pain -Likely secondary to a Mallory-Weiss tear versus gastritis due to history of ongoing alcohol use versus peptic ulcer disease. -Patient with no further nausea vomiting or hematemesis. -Continue PPI, Carafate. -Supportive care.  5.  Transaminitis/hyperbilirubinemia//hepatic steatosis -Initial pattern of transaminitis consistent with alcohol use. -Patient also with acute pancreatitis and as such we will check abdominal ultrasound to rule out gallstone pancreatitis. -LFTs trending down. -Repeat labs in the morning.  6.  Hypokalemia -Repleted.  7.  Alcohol abuse/history of alcohol withdrawal seizure -Patient with no significant withdrawal symptoms noted. -Continue the Ativan withdrawal protocol, thiamine, folic acid, multivitamin. - Librium prn.  8.  SVT/uncontrolled hypertension -Patient with prior history of SVT not on any medications. -TSH within normal limits. -Was on metoprolol and changed to Coreg for better blood pressure control however due to  recent anaphylactoid reaction Coreg has been discontinued. -Monitor. -If further control needed may consider Cardizem versus verapamil.  9.  Hypoglycemia -Patient with no history of diabetes. -Hemoglobin A1c 4.6. -Not on insulin. -Resolved.   DVT prophylaxis: SCDs Code Status: Full Family Communication: updated patient, no family at bedside. Disposition:   Status is: Inpatient  Remains inpatient appropriate because: Severity of illness       Consultants:  None  Procedures:  CT abd /pelvis 10/27/2021 MRCP abd/pelvis 10/28/2021  Antimicrobials:  None   Subjective: Was called by RN this morning while patient getting infusion of IV magnesium, I just received some downloaded patient noted with concern for anaphylactoid reaction with facial swelling, diffuse hives, itching.  Patient denies taking any other medications apart from what is being given to her in the hospital.  Patient on clear liquids.  No chest pain.  No shortness of breath.  Denies any tongue swelling or difficulty swallowing.  Patient with complaints of upper abdominal pain mainly in the epigastric region.  No further nausea or vomiting no hematemesis per patient.  Objective: Vitals:   10/30/21 1109 10/30/21 1230 10/30/21 1258 10/30/21 1435  BP:  122/79 112/71 (!) 94/56  Pulse:  (!) 122 (!) 112 (!) 110  Resp:  (!) 30 20 20   Temp:  (!) 100.7 F (38.2 C) (!) 102.5 F (39.2 C) (!) 101 F (38.3 C)  TempSrc:  Oral Oral Oral  SpO2:  97% 98% 96%  Weight: 63.6 kg     Height:        Intake/Output Summary (Last 24 hours) at 10/30/2021 1659 Last data filed at 10/30/2021 1512 Gross per 24 hour  Intake 2617.95 ml  Output 1200 ml  Net 1417.95 ml   Filed Weights   10/27/21 0242 10/27/21 1522 10/30/21 1109  Weight: 70.3 kg 71.5 kg 63.6 kg    Examination:  General exam: Appears calm and comfortable.  Facial swelling. Respiratory system: Clear to auscultation bilaterally, no wheezes, no crackles, no rhonchi.Marland Kitchen  Respiratory effort normal. Cardiovascular system: S1 & S2 heard, RRR. No JVD, murmurs, rubs, gallops or clicks. No pedal edema. Gastrointestinal system: Abdomen is nondistended, soft and tender to palpation predominantly in the epigastrium and right upper quadrant and upper abdominal region.  Positive bowel sounds.  No rebound.  No guarding. Central nervous system: Alert and oriented. No focal neurological deficits. Extremities: Symmetric 5 x 5 power. Skin: Hives noted on upper back, bilateral upper extremities. Psychiatry: Judgement and insight appear normal. Mood & affect appropriate.     Data Reviewed: I have personally reviewed following labs and imaging studies  CBC: Recent Labs  Lab 10/27/21 0301 10/27/21 0914 10/27/21 1039 10/29/21 0800 10/30/21 0346  WBC 4.7  --  4.1 4.2 5.9  NEUTROABS 2.5  --   --   --   --   HGB 14.8 13.3 12.5 13.6 12.8  HCT 44.0 39.0 37.0 40.6 38.4  MCV 105.3*  --  104.5* 108.6* 108.2*  PLT 223  --  175 144* 137*    Basic Metabolic Panel: Recent Labs  Lab 10/27/21 0651 10/27/21 0914 10/27/21 1039 10/27/21 1906 10/28/21 0800 10/28/21 1630 10/29/21 0800 10/30/21 0346  NA 134* 132*  --  135  --  138 135 136  K 4.1 3.8  --  3.4*  --  4.3 3.6 3.6  CL 97*  --   --  100  --  109 107 103  CO2 17*  --   --  22  --  21* 25 27  GLUCOSE 62*  --   --  104*  --  99 117* 94  BUN 6  --   --  <5*  --  <5* <5* <5*  CREATININE 0.69  --  0.74 0.77  --  0.57 0.70 0.75  CALCIUM 8.6*  --   --  9.6  --  9.1 9.0 9.1  MG  --   --   --   --  1.6* 2.4 1.8 1.4*  PHOS  --   --   --   --  3.1 1.9* 2.6 2.6    GFR: Estimated Creatinine Clearance: 93.4 mL/min (by C-G formula based on SCr of 0.75 mg/dL).  Liver Function Tests: Recent Labs  Lab 10/27/21 0651 10/27/21 1906 10/28/21 1630 10/29/21 0800 10/30/21 0346  AST 167* 148* 158* 96* 66*  ALT 95* 104* 91* 81* 65*  ALKPHOS 60 68 51 55 50  BILITOT 1.6* 2.5* 2.0* 1.6* 1.4*  PROT 6.8 8.4* 6.4* 6.5 6.0*   ALBUMIN 4.0 4.8 3.6 3.9 3.3*    CBG: Recent Labs  Lab 10/29/21 0046 10/29/21 0805 10/29/21 1601 10/29/21 2359 10/30/21 0811  GLUCAP 200* 117* 115* 84 90     Recent Results (from the past 240 hour(s))  Resp Panel by RT-PCR (Flu A&B, Covid) Nasopharyngeal Swab     Status: None   Collection Time: 10/27/21  7:49 AM   Specimen: Nasopharyngeal Swab; Nasopharyngeal(NP) swabs in vial transport medium  Result Value Ref Range Status   SARS Coronavirus 2 by RT PCR NEGATIVE NEGATIVE Final    Comment: (NOTE) SARS-CoV-2 target nucleic acids are NOT DETECTED.  The SARS-CoV-2 RNA is generally detectable in upper respiratory specimens during the acute phase of infection. The lowest concentration of SARS-CoV-2 viral copies this assay can detect is 138 copies/mL. A negative result does not preclude SARS-Cov-2 infection and should not be used as the sole basis for treatment or other patient management decisions. A negative result may occur with  improper specimen collection/handling, submission of specimen other than nasopharyngeal swab, presence of viral mutation(s) within the areas targeted by this assay, and inadequate number of viral copies(<138 copies/mL). A negative result must be combined with clinical observations, patient history, and epidemiological information. The expected result is Negative.  Fact Sheet for Patients:  EntrepreneurPulse.com.au  Fact Sheet for Healthcare Providers:  IncredibleEmployment.be  This test is no t yet approved or cleared by the [place] FDA and  has been authorized for detection and/or diagnosis of SARS-CoV-2 by FDA under an Emergency Use Authorization (EUA). This EUA will remain  in effect (meaning this test can be used) for the duration of the COVID-19 declaration under Section 564(b)(1) of the Act, 21 U.S.C.section 360bbb-3(b)(1), unless the authorization is terminated  or revoked sooner.        Influenza A by PCR NEGATIVE NEGATIVE Final   Influenza B by PCR NEGATIVE NEGATIVE Final    Comment: (NOTE) The Xpert Xpress SARS-CoV-2/FLU/RSV plus assay is intended as an aid in the diagnosis of influenza from Nasopharyngeal swab specimens and should not be used as a sole basis for treatment. Nasal washings and aspirates are unacceptable for Xpert Xpress SARS-CoV-2/FLU/RSV testing.  Fact Sheet for Patients: EntrepreneurPulse.com.au  Fact Sheet for Healthcare Providers: IncredibleEmployment.be  This test is not yet approved or cleared by the [place] FDA and has been authorized for detection and/or diagnosis of SARS-CoV-2 by FDA under an Emergency Use Authorization (EUA). This EUA will remain in effect (meaning this test  can be used) for the duration of the COVID-19 declaration under Section 564(b)(1) of the Act, 21 U.S.C. section 360bbb-3(b)(1), unless the authorization is terminated or revoked.  Performed at [facility], [address] [place], [place] [address]          Radiology Studies: No results found.      Scheduled Meds:  diphenhydrAMINE  25 mg Intravenous [id number]   folic acid  1 mg Oral Daily   LORazepam  0-4 mg Intravenous Q12H   methylPREDNISolone (SOLU-MEDROL) injection  60 mg Intravenous Q8H   multivitamin with minerals  1 tablet Oral Daily   pantoprazole (PROTONIX) IV  40 mg Intravenous Q12H   sucralfate  1 g Oral TID WC & HS   thiamine  100 mg Oral Daily   Or   thiamine  100 mg Intravenous Daily   Continuous Infusions:  famotidine (PEPCID) IV 40 mg (10/30/21 1511)   lactated ringers 125 mL/hr at 10/30/21 1101     LOS: 3 days    Time spent: 40 minutes    Irine Seal, MD [facility]   To contact the attending provider between 7A-7P or the covering provider during after hours 7P-7A, please log into the web site [email or web address] and access using universal [place]  password for that web site. If you do not have the password, please call the hospital operator.  10/30/2021, 4:59 PM

## 2021-10-31 DIAGNOSIS — K920 Hematemesis: Secondary | ICD-10-CM | POA: Diagnosis not present

## 2021-10-31 DIAGNOSIS — K859 Acute pancreatitis without necrosis or infection, unspecified: Secondary | ICD-10-CM

## 2021-10-31 LAB — CBC WITH DIFFERENTIAL/PLATELET
Abs Immature Granulocytes: 0.04 10*3/uL (ref 0.00–0.07)
Basophils Absolute: 0 10*3/uL (ref 0.0–0.1)
Basophils Relative: 0 %
Eosinophils Absolute: 0 10*3/uL (ref 0.0–0.5)
Eosinophils Relative: 0 %
HCT: 34.3 % — ABNORMAL LOW (ref 36.0–46.0)
Hemoglobin: 11.6 g/dL — ABNORMAL LOW (ref 12.0–15.0)
Immature Granulocytes: 1 %
Lymphocytes Relative: 5 %
Lymphs Abs: 0.3 10*3/uL — ABNORMAL LOW (ref 0.7–4.0)
MCH: 36 pg — ABNORMAL HIGH (ref 26.0–34.0)
MCHC: 33.8 g/dL (ref 30.0–36.0)
MCV: 106.5 fL — ABNORMAL HIGH (ref 80.0–100.0)
Monocytes Absolute: 0.3 10*3/uL (ref 0.1–1.0)
Monocytes Relative: 4 %
Neutro Abs: 5.8 10*3/uL (ref 1.7–7.7)
Neutrophils Relative %: 90 %
Platelets: 128 10*3/uL — ABNORMAL LOW (ref 150–400)
RBC: 3.22 MIL/uL — ABNORMAL LOW (ref 3.87–5.11)
RDW: 13.2 % (ref 11.5–15.5)
WBC: 6.5 10*3/uL (ref 4.0–10.5)
nRBC: 0 % (ref 0.0–0.2)

## 2021-10-31 LAB — COMPREHENSIVE METABOLIC PANEL
ALT: 54 U/L — ABNORMAL HIGH (ref 0–44)
AST: 56 U/L — ABNORMAL HIGH (ref 15–41)
Albumin: 3.3 g/dL — ABNORMAL LOW (ref 3.5–5.0)
Alkaline Phosphatase: 49 U/L (ref 38–126)
Anion gap: 13 (ref 5–15)
BUN: 6 mg/dL (ref 6–20)
CO2: 24 mmol/L (ref 22–32)
Calcium: 8.8 mg/dL — ABNORMAL LOW (ref 8.9–10.3)
Chloride: 102 mmol/L (ref 98–111)
Creatinine, Ser: 0.75 mg/dL (ref 0.44–1.00)
GFR, Estimated: >60 mL/min (ref >60)
Glucose, Bld: 101 mg/dL — ABNORMAL HIGH (ref 70–99)
Potassium: 3.9 mmol/L (ref 3.5–5.1)
Sodium: 139 mmol/L (ref 135–145)
Total Bilirubin: 1.1 mg/dL (ref 0.3–1.2)
Total Protein: 6.2 g/dL — ABNORMAL LOW (ref 6.5–8.1)

## 2021-10-31 LAB — MAGNESIUM: Magnesium: 2 mg/dL (ref 1.7–2.4)

## 2021-10-31 LAB — GLUCOSE, CAPILLARY
Glucose-Capillary: 107 mg/dL — ABNORMAL HIGH (ref 70–99)
Glucose-Capillary: 146 mg/dL — ABNORMAL HIGH (ref 70–99)
Glucose-Capillary: 96 mg/dL (ref 70–99)

## 2021-10-31 LAB — LIPASE, BLOOD: Lipase: 133 U/L — ABNORMAL HIGH (ref 11–51)

## 2021-10-31 MED ORDER — METHYLPREDNISOLONE SODIUM SUCC 125 MG IJ SOLR: 60.0000 mg | Freq: Every day | INTRAMUSCULAR | Status: DC

## 2021-10-31 MED ORDER — PREDNISONE 10 MG PO TABS: 40.0000 mg | ORAL_TABLET | Freq: Every day | ORAL | 0 refills | Status: AC

## 2021-10-31 MED ORDER — LORAZEPAM 2 MG/ML IJ SOLN
1.0000 mg | Freq: Once | INTRAMUSCULAR | Status: AC
  Administered 2021-10-31: 1 mg via INTRAVENOUS
  Filled 2021-10-31: qty 1

## 2021-10-31 NOTE — Plan of Care (Signed)

## 2021-11-01 ENCOUNTER — Telehealth: Payer: Self-pay | Admitting: General Practice

## 2021-11-01 LAB — VOLATILES,BLD-ACETONE,ETHANOL,ISOPROP,METHANOL
Acetone, blood: 0.012 g/dL — ABNORMAL HIGH (ref 0.000–0.010)
Ethanol, blood: <0.01 g/dL (ref 0.000–0.010)
Isopropanol, blood: <0.01 g/dL (ref 0.000–0.010)
Methanol, blood: <0.01 g/dL (ref 0.000–0.010)

## 2021-11-01 NOTE — Telephone Encounter (Signed)
Transition Care Management Follow-up Telephone Call Date of discharge and from where: 10/31/21 from [facility] How have you been since you were released from the hospital? Doing better. Any questions or concerns? No  Items Reviewed: Did the pt receive and understand the discharge instructions provided? Yes  Medications obtained and verified? Yes  Other? No  Any new allergies since your discharge? No  Dietary orders reviewed? Yes Do you have support at home? Yes   Home Care and Equipment/Supplies: Were home health services ordered? no  Functional Questionnaire: (I = Independent and D = Dependent) ADLs: I  Bathing/Dressing- I  Meal Prep- I  Eating- I  Maintaining continence- I  Transferring/Ambulation- I  Managing Meds- I  Follow up appointments reviewed:  PCP Hospital f/u appt confirmed? Yes  Scheduled to see Christen Butter, NP on 11/03/21 @ 11. Specialist Hospital f/u appt confirmed? No   Are transportation arrangements needed? No  If their condition worsens, is the pt aware to call PCP or go to the Emergency Dept.? Yes Was the patient provided with contact information for the PCP's office or ED? Yes Was to pt encouraged to call back with questions or concerns? Yes

## 2021-11-01 NOTE — TOC Transition Note (Signed)
Transition of Care [facility]) - CM/SW Discharge Note   Patient Details  Name: Debany Vantol MRN: [id number] Date of Birth: October 03, 1996  Transition of Care [facility]) CM/SW Contact:  Golda Acre, RN Phone Number: 11/01/2021, 9:38 AM   Clinical Narrative:     Transition of Care [facility]) Screening Note   Patient Details  Name: Tahari Clabaugh Date of Birth: 07-16-1996   Transition of Care [facility]) CM/SW Contact:    Golda Acre, RN Phone Number: 11/01/2021, 9:38 AM    Transition of Care Department [facility]) has reviewed patient and no TOC needs have been identified at this time. We will continue to monitor patient advancement through interdisciplinary progression rounds. If new patient transition needs arise, please place a TOC consult.     Final next level of care: Home/Self Care Barriers to Discharge: No Barriers Identified   Patient Goals and CMS Choice Patient states their goals for this hospitalization and ongoing recovery are:: to go home CMS Medicare.gov Compare Post Acute Care list provided to:: Patient    Discharge Placement                       Discharge Plan and Services   Discharge Planning Services: CM Consult                                 Social Determinants of Health (SDOH) Interventions     Readmission Risk Interventions No flowsheet data found.

## 2021-11-03 ENCOUNTER — Other Ambulatory Visit: Payer: Self-pay

## 2021-11-03 ENCOUNTER — Ambulatory Visit (INDEPENDENT_AMBULATORY_CARE_PROVIDER_SITE_OTHER): Payer: Medicaid Other | Admitting: Medical-Surgical

## 2021-11-03 ENCOUNTER — Encounter: Payer: Self-pay | Admitting: Medical-Surgical

## 2021-11-03 VITALS — BP 155/120 | HR 96 | Resp 20 | Ht 62.0 in | Wt 165.0 lb

## 2021-11-03 DIAGNOSIS — I10 Essential (primary) hypertension: Secondary | ICD-10-CM

## 2021-11-03 DIAGNOSIS — R87619 Unspecified abnormal cytological findings in specimens from cervix uteri: Secondary | ICD-10-CM

## 2021-11-03 DIAGNOSIS — F1010 Alcohol abuse, uncomplicated: Secondary | ICD-10-CM

## 2021-11-03 DIAGNOSIS — Z09 Encounter for follow-up examination after completed treatment for conditions other than malignant neoplasm: Secondary | ICD-10-CM

## 2021-11-03 DIAGNOSIS — F418 Other specified anxiety disorders: Secondary | ICD-10-CM | POA: Diagnosis not present

## 2021-11-03 NOTE — Progress Notes (Signed)
HPI with pertinent ROS:   CC: Hospital discharge follow-up  HPI: Pleasant 26 year old female presenting today for hospital discharge follow-up after being hospitalized with pancreatitis and alcoholic ketoacidosis.  She had previously been drinking copious amounts of wine on a daily basis and started having significant GI issues.  She was treated in the hospital and discharged on Librium.  She has been taking this although she admits to taking it outside of her normal prescription.  Instructions were to start with dosing 4 times daily and gradually decrease.  Unfortunately, she has not been able to remember to dose 4 times a day and she is only averaging twice a day.  Since her discharge, she has not had any alcohol although she knows this is going to be a struggle.  She is not convinced that she should quit drinking completely but does plan to cut down on her amounts in the future.  Since she discharged she has been feeling okay for the most part.  She is currently having issues with hives intermittently but has been taking prednisone.  Notes that her last dose of prednisone was this morning and she woke up without have any hives.  Believes the hives to have been a reaction to IV magnesium.  She is not currently doing any counseling or taking any medications to help with mood.  Admits that she was drinking wine to self medicate for anxiety, depression, as well as boredom.  She is open to counseling as well as treatment for her current mood issues.  Blood pressure is significantly elevated today.  She was started on metoprolol tartrate 25 mg twice daily while she was in the hospital and discharged on the same.  Reports sleeping late and waking just before her appointment time so her first dose of the day was taken in our parking lot and has not had a chance to take effect.  She does have a cuff at home and is able to manage her blood pressure with frequent monitoring.  She is overdue for a Pap smear and  notes that last year it was abnormal when it was checked.  She did fail to follow-up with this.  Reports that there were cellular changes as well as HPV found in her last 1.  Does not have a relationship with an OB/GYN at this time but would like to have a referral today.  I reviewed the past medical history, family history, social history, surgical history, and allergies today and no changes were needed.  Please see the problem list section below in epic for further details.   Physical exam:   General: Well Developed, well nourished, and in no acute distress.  Neuro: Alert and oriented x3.  HEENT: Normocephalic, atraumatic.  Skin: Warm and dry. Cardiac: Regular rate and rhythm.  Respiratory: Not using accessory muscles, speaking in full sentences.  Impression and Recommendations:    1. Hospital discharge follow-up Reviewed records regarding hospitalization and recommended treatment.  2. Alcohol abuse Discussed alcohol abuse and the likelihood of returning to unsafe amounts.  At this point would recommend she avoid alcohol altogether due to risk of recurrence of pancreatitis.  Continue Librium as prescribed and make sure to finish out prescription.  3. Anxiety with depression Starting sertraline 25 mg daily for the first 8 days and increase to 50 mg daily if tolerating well.  Referral placed for behavioral health counseling.  Discussed emergency psychological resources including our 24-hour behavioral health urgent care in [place].  4. Abnormal cervical Papanicolaou  smear, unspecified abnormal pap finding Referring to OB/GYN for follow-up on abnormal Pap smear.  5. Essential hypertension Continue metoprolol tartrate 25 mg twice daily.  Make sure to monitor blood pressure at home with a goal of 130/80 or less.  If consistently higher than that, we will need to adjust medications.  Return in 2 weeks for nurse visit for blood pressure check.  Bring arm cuff to validate readings and  accuracy.  Return in about 2 weeks (around 11/17/2021) for nurse visit for BP check. ___________________________________________ Thayer Ohm, DNP, APRN, FNP-BC Primary Care and Sports Medicine [facility]

## 2021-11-04 MED ORDER — SERTRALINE HCL 50 MG PO TABS: ORAL_TABLET | ORAL | 3 refills | Status: DC

## 2021-11-08 NOTE — Discharge Summary (Signed)
Physician Discharge Summary  L[place]idge UXL:[id number] DOB: [birth year] DOA: 10/27/2021  PCP: Christen Butter, NP  Admit date: 10/27/2021 Discharge date: 10/31/2021  Admitted From: Home.  Disposition:  Home.   Recommendations for Outpatient Follow-up:  Follow up with PCP in 1-2 weeks Please obtain BMP/CBC in one week Please follow up with gastroenterology as needed.   Discharge Condition:stable.  CODE STATUS:full code.  Diet recommendation: Heart Healthy  Brief/Interim Summary: 26 year old F with PMH of alcohol abuse, previous alcohol withdrawal seizure, HTN and SVT presenting with nausea and vomiting nausea, vomiting and epigastric pain followed by hematemesis, and admitted for the same.  She has mildly elevated LFTs, high anion gap metabolic acidosis and electrolyte derangements.  Her lipase is within normal.  CT abdomen and pelvis with low-density at the pancreaticoduodenal groove and marked hepatic steatosis.  She was also hypoglycemic requiring D5 infusion.  She was started on CIWA with scheduled and as needed Ativan.  MRCP ordered.   MRCP showed small pseudocyst and marked hepatic steatosis.  Lipase trended up to 668. Patient being treated for acute pancreatitis. On 10/30/2021 patient noted to have an anaphylactoid reaction.    Discharge Diagnoses:  Principal Problem:   Hematemesis Active Problems:   Sinus tachycardia   Tachypnea   Hyponatremia   Hypokalemia   High anion gap metabolic acidosis   Transaminitis   Alcohol abuse   Pancreatic lesion   Anaphylactoid reaction   Acute pancreatitis   Nausea and vomiting  #1 acute alcoholic pancreatitis versus gallstone pancreatitis -Patient noted to have lipase that trended from normal and elevated to 668. -Patient with worsening progressive abdominal pain early on during the hospitalization currently controlled with pain medication. -CT abdomen and pelvis done with low density at the pancreaticoduodenal groove indeterminate,  marked hepatic steatosis. -MRCP with concern for pancreatic pseudocyst. -Lipase level at 540 from 668 from 181 from 41. -LFTs trending down. -Supportive care. - recommended to follow up w ith GI as outpatient.  - pt currently asymptomatic.   2.  Anaphylactoid reaction -Patient noted to have anaphylactoid reaction this morning with hives, itching, facial swelling, some arm swelling. -Patient just received a dose of IV magnesium which I doubt is causing the reaction. -Patient noted to only be on clear liquids. -Patient noted to also have recently received some Dilaudid and started on oral Coreg yesterday. -Discontinue Dilaudid, discontinue Coreg. -Placed on IV Solu-Medrol 60 mg every 8 hours, IV Benadryl 25 mg IV every 8 hours, Pepcid 40 mg IV every 12 hours. Transitioned to prednisone for 3 days for discharge.  -We will likely need outpatient follow-up and will likely need to see an allergist in the outpatient setting. -Supportive care.  3.  Hypomagnesemia -Magnesium sulfate 4 g IV x1.   4.  Nausea vomiting hematemesis/epigastric pain -Likely secondary to a Mallory-Weiss tear versus gastritis due to history of ongoing alcohol use versus peptic ulcer disease. -Patient with no further nausea vomiting or hematemesis. -Continue PPI, Carafate. -Supportive care.  5.  Transaminitis/hyperbilirubinemia//hepatic steatosis -Initial pattern of transaminitis consistent with alcohol use. -Patient also with acute pancreatitis and as such we will check abdominal ultrasound to rule out gallstone pancreatitis. [place] abd some dilated CBD, discussed with Dr Rushie Goltz pt is asymptomatic and LFTS are trending down, no further work up needed at this time.  -LFTs trending down.   6.  Hypokalemia -Repleted.  7.  Alcohol abuse/history of alcohol withdrawal seizure -Patient with no significant withdrawal symptoms noted. -Continue the Ativan withdrawal protocol, thiamine, folic acid, multivitamin.  8.   SVT/uncontrolled hypertension -Patient with prior history of SVT not on any medications. -TSH within normal limits. -Was on metoprolol and changed to Coreg for better blood pressure control however due to recent anaphylactoid reaction Coreg has been discontinued. -Monitor. -If further control needed may consider Cardizem versus verapamil.  9.  Hypoglycemia -Patient with no history of diabetes. -Hemoglobin A1c 4.6. -Not on insulin. -Resolved.     DVT prophylaxis  Discharge Instructions  Discharge Instructions     Call MD for:  difficulty breathing, headache or visual disturbances   Complete by: As directed    Call MD for:  extreme fatigue   Complete by: As directed    Call MD for:  persistant dizziness or light-headedness   Complete by: As directed    Call MD for:  persistant nausea and vomiting   Complete by: As directed    Call MD for:  severe uncontrolled pain   Complete by: As directed    Diet - low sodium heart healthy   Complete by: As directed    Diet general   Complete by: As directed    Soft diet   Discharge instructions   Complete by: As directed    It has been a pleasure taking care of you!  You were hospitalized with nausea, vomiting and abdominal pain likely due to alcohol.  Your nausea and vomiting resolved.  Your MRI showed some changes in your liver and pancreas likely related to alcohol. We strongly recommend you quit drinking alcohol.  We are discharging you oral Librium tablet to reduce your risk of alcohol withdrawal symptoms.  In addition to stopping alcohol, will recommend lifestyle change such as exercise and dietary changes to lose some weight.  We have started you on acid reflux medication for you abdominal pain.  We also started you on a low-dose metoprolol for your tachycardia. Please review your new medication list and the directions on your medications as prescribed.  Follow-up with your primary care doctor in 1 to 2 weeks or sooner if  needed.   Take care,   Discharge instructions   Complete by: As directed    Please follow up with PCP in one week.   Increase activity slowly   Complete by: As directed    Increase activity slowly   Complete by: As directed       Allergies as of 10/31/2021       Reactions   Lexapro [escitalopram Oxalate] Shortness Of Breath, Palpitations   Shivers, muscle twitches, heavy sweating, auditory hallucinations        Medication List     TAKE these medications    folic acid 1 MG tablet Commonly known as: FOLVITE Take 1 tablet (1 mg total) by mouth daily.   metoprolol tartrate 25 MG tablet Commonly known as: LOPRESSOR Take 0.5 tablets (12.5 mg total) by mouth 2 (two) times daily.   multivitamin with minerals Tabs tablet Take 1 tablet by mouth daily.   ondansetron 4 MG tablet Commonly known as: ZOFRAN Take 1 tablet (4 mg total) by mouth every 8 (eight) hours as needed for nausea.   pantoprazole 40 MG tablet Commonly known as: Protonix Take 1 tablet (40 mg total) by mouth daily.   thiamine 100 MG tablet Take 1 tablet (100 mg total) by mouth daily.       ASK your doctor about these medications    chlordiazePOXIDE 10 MG capsule Commonly known as: LIBRIUM Take 1 capsule (10 mg total) by mouth 4 (  four) times daily for 2 days, THEN 1 capsule (10 mg total) 3 (three) times daily for 2 days, THEN 1 capsule (10 mg total) in the morning and at bedtime for 2 days, THEN 1 capsule (10 mg total) daily for 2 days. Start taking on: October 28, 2021 Ask about: Should I take this medication?   predniSONE 10 MG tablet Commonly known as: DELTASONE Take 4 tablets (40 mg total) by mouth daily for 3 days. Ask about: Should I take this medication?        Follow-up Information     Christen Butter, NP. Schedule an appointment as soon as possible for a visit in 2 days.   Specialty: Nurse Practitioner Contact information: [address] [place]  [address] [phone]                Allergies  Allergen Reactions   Lexapro [Escitalopram Oxalate] Shortness Of Breath and Palpitations    Shivers, muscle twitches, heavy sweating, auditory hallucinations   Magnesium-Containing Compounds Hives    Consultations: Discussed with Dr Ewing Schlein    Procedures/Studies: MR ABDOMEN W WO CONTRAST  Result Date: 10/28/2021 CLINICAL DATA:  26 year old female with history of possible mass in the pancreas on recent CT examination. EXAM: MRI ABDOMEN WITHOUT AND WITH CONTRAST TECHNIQUE: Multiplanar multisequence MR imaging of the abdomen was performed both before and after the administration of intravenous contrast. CONTRAST:  84mL GADAVIST GADOBUTROL 1 MMOL/ML IV SOLN COMPARISON:  No prior abdominal MRI. CT the abdomen and pelvis 10/27/2021. FINDINGS: Lower chest: Unremarkable. Hepatobiliary: Diffuse loss of signal intensity throughout the hepatic parenchyma, indicative of hepatic steatosis. No suspicious cystic or solid hepatic lesions. No intra or extrahepatic biliary ductal dilatation noted on MRCP images. Amorphous high signal intensity throughout the gallbladder on T1 weighted images, indicative of biliary sludge. Gallbladder is moderately distended. Gallbladder wall thickness is normal. No pericholecystic fluid. 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 No other solid pancreatic mass. No pancreatic ductal dilatation noted on MRCP images. Spleen:  Unremarkable. Adrenals/Urinary Tract: Bilateral kidneys and bilateral adrenal glands are normal in appearance. No hydroureteronephrosis in the visualized portions of the abdomen. Stomach/Bowel: Visualized portions are unremarkable. Vascular/Lymphatic: No aneurysm identified in the visualized  abdominal vasculature. No lymphadenopathy noted in the abdomen. Other: No significant volume of ascites noted in the visualized portions of the peritoneal cavity. Musculoskeletal: No aggressive appearing osseous lesions are noted in the visualized portions of the skeleton. IMPRESSION: 1. 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 This currently measures 1.2 x 1.0 x 1.3 cm. No definite communication with the main pancreatic duct. Repeat abdominal MRI with and without IV gadolinium with MRCP is recommended in 1 year. This recommendation follows ACR consensus guidelines: Management of Incidental Pancreatic Cysts: A White Paper of the ACR Incidental Findings Committee. J Am Coll Radiol 2017;14:911-923. 2. Severe hepatic steatosis. Electronically Signed   By: Trudie Reed M.D.   On: 10/28/2021 13:48   US Abdomen Complete  Result Date: 10/30/2021 CLINICAL DATA:  Acute pancreatitis. EXAM: ABDOMEN ULTRASOUND COMPLETE COMPARISON:  Abdominal MRI 10/28/2021, CT 10/27/2021 FINDINGS: Gallbladder: Physiologically distended. No gallstones or wall thickening visualized. No sonographic Murphy sign noted by sonographer. Common bile duct: Diameter: 10 mm,  dilated. Liver: Diffusely increased parenchymal echogenicity. No focal lesion. No capsular nodularity. Mild central intrahepatic biliary ductal dilatation. Portal vein is patent on color Doppler imaging with normal direction of blood flow towards the liver. IVC: No abnormality visualized. Pancreas: Proximal pancreas including the head and neck are not well visualized due to overlying bowel gas. Distal body and tail are unremarkable. Spleen: Size and appearance within normal limits. Right Kidney: Length: 9.4 cm. Normal parenchymal echogenicity. No hydronephrosis. No visualized stone or focal lesion. Left Kidney: Length: 9.6 cm. Normal parenchymal echogenicity. No hydronephrosis. No visualized stone or focal lesion.  Abdominal aorta: No aneurysm visualized.  Distal aorta is obscured. Other findings: Small amount of upper abdominal ascites in the right and left upper quadrants. IMPRESSION: 1. Biliary dilatation with common bile duct measuring 10 mm. This is new from recent prior imaging, proximal common bile duct was 7 mm on recent MRCP. Etiology is uncertain. 2. Hepatic steatosis. 3. Head and neck of the pancreas are obscured by bowel gas, distal body and tail are unremarkable in sonographic appearance. 4. Small amount of ascites in the right left upper quadrant Electronically Signed   By: Narda RutherfordMelanie  Sanford M.D.   On: 10/30/2021 20:49   CT ABDOMEN PELVIS W CONTRAST  Result Date: 10/27/2021 CLINICAL DATA:  Epigastric pain EXAM: CT ABDOMEN AND PELVIS WITH CONTRAST TECHNIQUE: Multidetector CT imaging of the abdomen and pelvis was performed using the standard protocol following bolus administration of intravenous contrast. CONTRAST:  100mL OMNIPAQUE IOHEXOL 300 MG/ML  SOLN COMPARISON:  None. FINDINGS: Lower chest:  No contributory findings. Hepatobiliary: Marked hepatic steatosis.No evidence of biliary obstruction or stone. Pancreas: Relative low-density between the duodenal C-loop and pancreatic head measuring up to 16 mm. No pancreatic expansion or adjacent fat inflammation. Spleen: Unremarkable. Adrenals/Urinary Tract: Negative adrenals. No hydronephrosis or stone. Unremarkable bladder. Stomach/Bowel:  No obstruction. No appendicitis. Vascular/Lymphatic: No acute vascular abnormality. No mass or adenopathy. Reproductive:No pathologic findings. Abdominal wall scarring which may be from Cesarian Other: No ascites or pneumoperitoneum. Musculoskeletal: No acute abnormalities. IMPRESSION: 1. Low-density at the pancreaticoduodenal groove which is indeterminate. Ill-defined pseudo cyst, mass, or diverticulum are all considered given location. After resolution of the acute episode, recommend pancreas protocol MRI with contrast. 2.  Marked hepatic steatosis. Electronically Signed   By: Tiburcio PeaJonathan  Watts M.D.   On: 10/27/2021 06:45      Subjective: No complaints.   Discharge Exam: Vitals:   10/31/21 0624 10/31/21 1439  BP: 121/90 124/81  Pulse: 88 (!) 105  Resp: 19 18  Temp: 97.6 F (36.4 C) 98.1 F (36.7 C)  SpO2: 99% 100%   Vitals:   10/31/21 0213 10/31/21 0500 10/31/21 0624 10/31/21 1439  BP: 124/82  121/90 124/81  Pulse: 91  88 (!) 105  Resp: 18  19 18   Temp: 98.1 F (36.7 C)  97.6 F (36.4 C) 98.1 F (36.7 C)  TempSrc: Oral  Oral Oral  SpO2: 99%  99% 100%  Weight:  75.1 kg    Height:        General: Pt is alert, awake, not in acute distress Cardiovascular: RRR, S1/S2 +, no rubs, no gallops Respiratory: CTA bilaterally, no wheezing, no rhonchi Abdominal: Soft, NT, ND, bowel sounds + Extremities: no edema, no cyanosis    The results of significant diagnostics from this hospitalization (including imaging, microbiology, ancillary and laboratory) are listed below for reference.     Microbiology: No results found for this or any previous visit (from the past 240 hour(s)).  Labs: BNP (last 3 results) No results for input(s): BNP in the last 8760 hours. Basic Metabolic Panel: No results for input(s): NA, K, CL, CO2, GLUCOSE, BUN, CREATININE, CALCIUM, MG, PHOS in the last 168 hours. Liver Function Tests: No results for input(s): AST, ALT, ALKPHOS, BILITOT, PROT, ALBUMIN in the last 168 hours. No results for input(s): LIPASE, AMYLASE in the last 168 hours. No results for input(s): AMMONIA in the last 168 hours. CBC: No results for input(s): WBC, NEUTROABS, HGB, HCT, MCV, PLT in the last 168 hours. Cardiac Enzymes: No results for input(s): CKTOTAL, CKMB, CKMBINDEX, TROPONINI in the last 168 hours. BNP: Invalid input(s): POCBNP CBG: No results for input(s): GLUCAP in the last 168 hours. D-Dimer No results for input(s): DDIMER in the last 72 hours. Hgb A1c No results for input(s): HGBA1C  in the last 72 hours. Lipid Profile No results for input(s): CHOL, HDL, LDLCALC, TRIG, CHOLHDL, LDLDIRECT in the last 72 hours. Thyroid function studies No results for input(s): TSH, T4TOTAL, T3FREE, THYROIDAB in the last 72 hours.  Invalid input(s): FREET3 Anemia work up No results for input(s): VITAMINB12, FOLATE, FERRITIN, TIBC, IRON, RETICCTPCT in the last 72 hours. Urinalysis    Component Value Date/Time   COLORURINE YELLOW 10/27/2021 0301   APPEARANCEUR CLEAR 10/27/2021 0301   LABSPEC 1.027 10/27/2021 0301   PHURINE 5.5 10/27/2021 0301   GLUCOSEU NEGATIVE 10/27/2021 0301   HGBUR NEGATIVE 10/27/2021 0301   BILIRUBINUR NEGATIVE 10/27/2021 0301   BILIRUBINUR negative 06/12/2020 0920   KETONESUR >80 (A) 10/27/2021 0301   PROTEINUR 30 (A) 10/27/2021 0301   UROBILINOGEN 0.2 06/12/2020 0920   NITRITE NEGATIVE 10/27/2021 0301   LEUKOCYTESUR NEGATIVE 10/27/2021 0301   Sepsis Labs Invalid input(s): PROCALCITONIN,  WBC,  LACTICIDVEN Microbiology No results found for this or any previous visit (from the past 240 hour(s)).   Time coordinating discharge: 39 minutes.   SIGNED:   Kathlen ModyVijaya Leo Weyandt, MD  [facility]

## 2021-11-28 ENCOUNTER — Other Ambulatory Visit: Payer: Self-pay | Admitting: Medical-Surgical

## 2021-11-29 DIAGNOSIS — Z419 Encounter for procedure for purposes other than remedying health state, unspecified: Secondary | ICD-10-CM | POA: Diagnosis not present

## 2021-11-30 ENCOUNTER — Encounter: Payer: Medicaid Other | Admitting: Obstetrics and Gynecology

## 2021-11-30 ENCOUNTER — Telehealth: Payer: Self-pay | Admitting: *Deleted

## 2021-11-30 NOTE — Telephone Encounter (Signed)
Returned call from 1:23 PM. Left patient a message to call and reschedule appointment.

## 2021-11-30 NOTE — Progress Notes (Deleted)
° ° °  GYNECOLOGY OFFICE COLPOSCOPY PROCEDURE NOTE  26 y.o. G2P0010 here for colposcopy for {Findings; lab pap smear results:16707::"no abnormalities"} pap smear on ***.   *** 10/2020 ASCUS/HPV pos, non 16/18  Pregnancy test:  {Blank single:19197::"positive","negative","not indicated"} Gardasil:  {Blank single:19197::"completed","not yet had, declines","not yet had, accepts"}. Discussed role for HPV in cervical dysplasia, need for surveillance.  Informed consent and review of risks, benefit and alternatives performed. Written consent given.   Speculum inserted into patient's vagina assuring full view of cervix and vaginal walls. 3 swabs of vinegar solution applied to the cervix and vaginal walls and colposcope was used to observe both the cervix and vaginal walls.   Colposcopy adequate? {yes/no:20286}  {Findings; colposcopy:728}; corresponding biopsies obtained.  ECC collected.  All specimens were labeled and sent to pathology.  Monsel's applied to biopsy sites for good hemostasis and speculum removed.  Pt tolerated well with minimal pain and bleeding.   Patient was given post procedure instructions.  Will follow up pathology and manage accordingly; patient will be contacted with results and recommendations.  Routine preventative health maintenance measures emphasized.    Radene Gunning, MD, [place] for [facility], [place]

## 2021-12-27 DIAGNOSIS — Z419 Encounter for procedure for purposes other than remedying health state, unspecified: Secondary | ICD-10-CM | POA: Diagnosis not present

## 2022-01-27 DIAGNOSIS — Z419 Encounter for procedure for purposes other than remedying health state, unspecified: Secondary | ICD-10-CM | POA: Diagnosis not present

## 2022-02-26 DIAGNOSIS — Z419 Encounter for procedure for purposes other than remedying health state, unspecified: Secondary | ICD-10-CM | POA: Diagnosis not present

## 2022-03-03 ENCOUNTER — Emergency Department (HOSPITAL_BASED_OUTPATIENT_CLINIC_OR_DEPARTMENT_OTHER)
Admission: EM | Admit: 2022-03-03 | Discharge: 2022-03-03 | Disposition: A | Discharge status: Home / Self Care | Payer: Medicaid Other | Attending: Emergency Medicine | Admitting: Emergency Medicine

## 2022-03-03 ENCOUNTER — Encounter (HOSPITAL_BASED_OUTPATIENT_CLINIC_OR_DEPARTMENT_OTHER): Payer: Self-pay

## 2022-03-03 ENCOUNTER — Other Ambulatory Visit: Payer: Self-pay

## 2022-03-03 DIAGNOSIS — I10 Essential (primary) hypertension: Secondary | ICD-10-CM | POA: Insufficient documentation

## 2022-03-03 DIAGNOSIS — N9489 Other specified conditions associated with female genital organs and menstrual cycle: Secondary | ICD-10-CM | POA: Diagnosis not present

## 2022-03-03 DIAGNOSIS — Z79899 Other long term (current) drug therapy: Secondary | ICD-10-CM | POA: Insufficient documentation

## 2022-03-03 DIAGNOSIS — F1010 Alcohol abuse, uncomplicated: Secondary | ICD-10-CM | POA: Insufficient documentation

## 2022-03-03 DIAGNOSIS — R569 Unspecified convulsions: Secondary | ICD-10-CM | POA: Diagnosis not present

## 2022-03-03 LAB — COMPREHENSIVE METABOLIC PANEL
ALT: 38 U/L (ref 0–44)
AST: 164 U/L — ABNORMAL HIGH (ref 15–41)
Albumin: 5.1 g/dL — ABNORMAL HIGH (ref 3.5–5.0)
Alkaline Phosphatase: 58 U/L (ref 38–126)
Anion gap: 16 — ABNORMAL HIGH (ref 5–15)
BUN: 5 mg/dL — ABNORMAL LOW (ref 6–20)
CO2: 23 mmol/L (ref 22–32)
Calcium: 8.6 mg/dL — ABNORMAL LOW (ref 8.9–10.3)
Chloride: 94 mmol/L — ABNORMAL LOW (ref 98–111)
Creatinine, Ser: 0.67 mg/dL (ref 0.44–1.00)
GFR, Estimated: >60 mL/min (ref >60)
Glucose, Bld: 93 mg/dL (ref 70–99)
Potassium: 3.9 mmol/L (ref 3.5–5.1)
Sodium: 133 mmol/L — ABNORMAL LOW (ref 135–145)
Total Bilirubin: 1.8 mg/dL — ABNORMAL HIGH (ref 0.3–1.2)
Total Protein: 8.3 g/dL — ABNORMAL HIGH (ref 6.5–8.1)

## 2022-03-03 LAB — CBC WITH DIFFERENTIAL/PLATELET
Abs Immature Granulocytes: 0.03 10*3/uL (ref 0.00–0.07)
Basophils Absolute: 0.1 10*3/uL (ref 0.0–0.1)
Basophils Relative: 1 %
Eosinophils Absolute: 0.1 10*3/uL (ref 0.0–0.5)
Eosinophils Relative: 1 %
HCT: 38.4 % (ref 36.0–46.0)
Hemoglobin: 13.2 g/dL (ref 12.0–15.0)
Immature Granulocytes: 1 %
Lymphocytes Relative: 24 %
Lymphs Abs: 1.3 10*3/uL (ref 0.7–4.0)
MCH: 34.7 pg — ABNORMAL HIGH (ref 26.0–34.0)
MCHC: 34.4 g/dL (ref 30.0–36.0)
MCV: 101.1 fL — ABNORMAL HIGH (ref 80.0–100.0)
Monocytes Absolute: 0.3 10*3/uL (ref 0.1–1.0)
Monocytes Relative: 6 %
Neutro Abs: 3.8 10*3/uL (ref 1.7–7.7)
Neutrophils Relative %: 67 %
Platelets: 133 10*3/uL — ABNORMAL LOW (ref 150–400)
RBC: 3.8 MIL/uL — ABNORMAL LOW (ref 3.87–5.11)
RDW: 14.5 % (ref 11.5–15.5)
WBC: 5.6 10*3/uL (ref 4.0–10.5)
nRBC: 0 % (ref 0.0–0.2)

## 2022-03-03 LAB — HCG, SERUM, QUALITATIVE: Preg, Serum: NEGATIVE

## 2022-03-03 LAB — CBG MONITORING, ED: Glucose-Capillary: 120 mg/dL — ABNORMAL HIGH (ref 70–99)

## 2022-03-03 LAB — ETHANOL: Alcohol, Ethyl (B): <10 mg/dL (ref <10)

## 2022-03-03 MED ORDER — THIAMINE HCL 100 MG/ML IJ SOLN: 100.0000 mg | Freq: Every day | INTRAMUSCULAR | Status: DC

## 2022-03-03 MED ORDER — CHLORDIAZEPOXIDE HCL 25 MG PO CAPS: ORAL_CAPSULE | ORAL | 0 refills | Status: AC

## 2022-03-03 MED ORDER — SODIUM CHLORIDE 0.9 % IV BOLUS
1000.0000 mL | Freq: Once | INTRAVENOUS | Status: AC
  Administered 2022-03-03: 1000 mL via INTRAVENOUS

## 2022-03-03 MED ORDER — LORAZEPAM 1 MG PO TABS: 0.0000 mg | ORAL_TABLET | Freq: Two times a day (BID) | ORAL | Status: DC

## 2022-03-03 MED ORDER — LORAZEPAM 2 MG/ML IJ SOLN: 0.0000 mg | Freq: Two times a day (BID) | INTRAMUSCULAR | Status: DC

## 2022-03-03 MED ORDER — LORAZEPAM 2 MG/ML IJ SOLN: 0.0000 mg | Freq: Four times a day (QID) | INTRAMUSCULAR | Status: DC

## 2022-03-03 MED ORDER — THIAMINE HCL 100 MG PO TABS: 100.0000 mg | ORAL_TABLET | Freq: Every day | ORAL | Status: DC

## 2022-03-03 MED ORDER — LORAZEPAM 1 MG PO TABS: 0.0000 mg | ORAL_TABLET | Freq: Four times a day (QID) | ORAL | Status: DC

## 2022-03-03 NOTE — Discharge Instructions (Addendum)
You were seen today for seizure-like activity.  This may be related to your alcohol use although you do not show any other significant signs of withdrawal at this time.  Take Librium to help with withdrawal symptoms.  Given concern for seizure and unclear whether this is related to alcohol ?

## 2022-03-03 NOTE — ED Provider Notes (Signed)
?[facility]PT ?Provider Note ? ? ?CSN: [id number] ?Arrival date & time: 03/03/22  0100 ? ?  ? ?History ? ?Chief Complaint  ?Patient presents with  ? Seizures  ? ? ?Leslie Tucker is a 26 y.o. female. ? ?HPI ? ?  ? ?This is a 26 year old female with a history of hypertension, alcohol abuse, SVT who presents with seizure-like activity.  Patient reports that she was at home and her mother witnessed a possible seizure.  Patient reports that she has a history of daily alcohol use.  She drinks 4-5 white claws daily.  She has been trying to self wean.  She is currently down to 3/day.  She reports 1 prior history of seizure in the setting of trying to taper alcohol.  She states that she was by herself and no one witnessed that.  She states that she got an aura and then "woke up."  She had a similar feeling tonight.  Mother describes her shaking and then beginning to snore.  They called EMS and patient was back to her baseline.  Patient reports that she is somewhat shaky.  She last drank alcohol at 9 PM last night. ? ?Chart reviewed.  Patient was admitted late December for alcoholic ketoacidosis and pancreatitis. ? ?Home Medications ?Prior to Admission medications   ?Medication Sig Start Date End Date Taking? Authorizing Provider  ?chlordiazePOXIDE (LIBRIUM) 25 MG capsule 50mg  PO TID x 1D, then 25-50mg  PO BID X 1D, then 25-50mg  PO QD X 1D 03/03/22  Yes Iley Deignan, 05/03/22, MD  ?folic acid (FOLVITE) 1 MG tablet Take 1 tablet (1 mg total) by mouth daily. 10/29/21   12/27/21, MD  ?metoprolol tartrate (LOPRESSOR) 25 MG tablet Take 0.5 tablets (12.5 mg total) by mouth 2 (two) times daily. 10/28/21   10/30/21, MD  ?Multiple Vitamin (MULTIVITAMIN WITH MINERALS) TABS tablet Take 1 tablet by mouth daily. 10/29/21   12/27/21, MD  ?ondansetron (ZOFRAN) 4 MG tablet Take 1 tablet (4 mg total) by mouth every 8 (eight) hours as needed for nausea. 10/28/21   10/30/21, MD  ?pantoprazole (PROTONIX) 40  MG tablet Take 1 tablet (40 mg total) by mouth daily. 10/28/21 12/27/21  02/26/22, MD  ?sertraline (ZOLOFT) 50 MG tablet TAKE 1/2 TABLET (25 MG) DAILY FOR 8 DAYS THEN INCREASE TO 1 TABLET (50 MG) DAILY. 11/28/21   11/30/21, NP  ?thiamine 100 MG tablet Take 1 tablet (100 mg total) by mouth daily. 10/29/21   12/27/21, MD  ?   ? ?Allergies    ?Lexapro [escitalopram oxalate] and Magnesium-containing compounds   ? ?Review of Systems   ?Review of Systems  ?Constitutional:  Negative for fever.  ?Respiratory:  Negative for shortness of breath.   ?Cardiovascular:  Negative for chest pain.  ?Gastrointestinal:  Negative for abdominal pain, nausea and vomiting.  ?Neurological:  Positive for tremors and seizures.  ?All other systems reviewed and are negative. ? ?Physical Exam ?Updated Vital Signs ?BP (!) 146/93   Pulse 98   Temp 99.8 ?F (37.7 ?C) (Oral)   Resp 18   Ht 1.549 m (5\' 1" )   Wt 72.6 kg   SpO2 100%   BMI 30.23 kg/m?  ?Physical Exam ?Vitals and nursing note reviewed.  ?Constitutional:   ?   Appearance: She is well-developed. She is not ill-appearing.  ?HENT:  ?   Head: Normocephalic and atraumatic.  ?   Nose: Nose normal.  ?  Mouth/Throat:  ?   Mouth: Mucous membranes are moist.  ?Eyes:  ?   Pupils: Pupils are equal, round, and reactive to light.  ?Cardiovascular:  ?   Rate and Rhythm: Normal rate and regular rhythm.  ?   Heart sounds: Normal heart sounds.  ?Pulmonary:  ?   Effort: Pulmonary effort is normal. No respiratory distress.  ?   Breath sounds: No wheezing.  ?Abdominal:  ?   Palpations: Abdomen is soft.  ?   Tenderness: There is no abdominal tenderness.  ?Musculoskeletal:  ?   Cervical back: Neck supple.  ?Skin: ?   General: Skin is warm and dry.  ?Neurological:  ?   Mental Status: She is alert and oriented to person, place, and time.  ?   Comments: Slight tremor noted  ?Psychiatric:     ?   Mood and Affect: Mood normal.  ? ? ?ED Results / Procedures / Treatments   ?Labs ?(all labs ordered are  listed, but only abnormal results are displayed) ?Labs Reviewed  ?CBC WITH DIFFERENTIAL/PLATELET - Abnormal; Notable for the following components:  ?    Result Value  ? RBC 3.80 (*)   ? MCV 101.1 (*)   ? MCH 34.7 (*)   ? Platelets 133 (*)   ? All other components within normal limits  ?COMPREHENSIVE METABOLIC PANEL - Abnormal; Notable for the following components:  ? Sodium 133 (*)   ? Chloride 94 (*)   ? BUN 5 (*)   ? Calcium 8.6 (*)   ? Total Protein 8.3 (*)   ? Albumin 5.1 (*)   ? AST 164 (*)   ? Total Bilirubin 1.8 (*)   ? Anion gap 16 (*)   ? All other components within normal limits  ?CBG MONITORING, ED - Abnormal; Notable for the following components:  ? Glucose-Capillary 120 (*)   ? All other components within normal limits  ?ETHANOL  ?HCG, SERUM, QUALITATIVE  ? ? ?EKG ?None ? ?Radiology ?No results found. ? ?Procedures ?Procedures  ? ? ?Medications Ordered in ED ?Medications  ?LORazepam (ATIVAN) injection 0-4 mg (0 mg Intravenous Hold 03/03/22 0330)  ?  Or  ?LORazepam (ATIVAN) tablet 0-4 mg ( Oral See Alternative 03/03/22 0330)  ?LORazepam (ATIVAN) injection 0-4 mg (has no administration in time range)  ?  Or  ?LORazepam (ATIVAN) tablet 0-4 mg (has no administration in time range)  ?thiamine tablet 100 mg (has no administration in time range)  ?  Or  ?thiamine (B-1) injection 100 mg (has no administration in time range)  ?sodium chloride 0.9 % bolus 1,000 mL (0 mLs Intravenous Stopped 03/03/22 0608)  ? ? ?ED Course/ Medical Decision Making/ A&P ?  ?                        ?Medical Decision Making ?Amount and/or Complexity of Data Reviewed ?Labs: ordered. ? ?Risk ?OTC drugs. ?Prescription drug management. ? ? ?This patient presents to the ED for concern of seizure, this involves an extensive number of treatment options, and is a complaint that carries with it a high risk of complications and morbidity.  The differential diagnosis includes seizure, syncope, alcohol withdrawal ? ?MDM:   ? ?This is a 26 year old  female who presents with concern for seizure.  She is nontoxic and vital signs are reassuring upon arrival.  The described episode does sound potentially like a seizure; however, patient has no other overt signs of withdrawal.  CIWA score is  2.  She also states that she last drank alcohol at 9 PM last night.  This would make withdrawal less likely although not impossible.  Labs obtained.  Slight hyponatremia.  EtOH negative.  Patient was given fluids.  She did not score high enough to receive any Ativan.  AST is 164 likely related to alcohol use.  Is unclear to me whether this episode was a true seizure and/or whether it was alcohol withdrawal related.  She is not showing any overt signs of alcohol withdrawal now and is not scoring on the CIWA score.  She is certainly at high risk.  She reports 1 prior episode but again this was unwitnessed and only reported by the patient.  We will put her on seizure precautions.  Will discharge with a Librium taper and outpatient follow-up.  I have also placed an ambulatory referral to neurology for further evaluation for these episodes and differentiation as to whether or not they may be alcohol related.  Patient was advised regarding the dangers alcohol detox not in a medical setting ?Admission considered for withdrawal ?(Labs, imaging, consults) ? ?Labs: ?I Ordered, and personally interpreted labs.  The pertinent results include: Sodium 133, AST 164 ? ?Imaging Studies ordered: ?I ordered imaging studies including none ?I independently visualized and interpreted imaging. ?I agree with the radiologist interpretation ? ?Additional history obtained from mother.  External records from outside source obtained and reviewed including prior admission ? ?Cardiac Monitoring: ?The patient was maintained on a cardiac monitor.  I personally viewed and interpreted the cardiac monitored which showed an underlying rhythm of: Sinus rhythm ? ?Reevaluation: ?After the interventions noted above, I  reevaluated the patient and found that they have :improved ? ?Social Determinants of Health: ?Alcohol abuse ? ?Disposition: Discharge ? ?Co morbidities that complicate the patient evaluation ? ?Past Medical Hist

## 2022-03-03 NOTE — ED Triage Notes (Signed)
Pt reports that she has a seizure that lasted 2-3 minutes, hx of 1 seizure in the past, not on medications, bit tongue, no loss of bowel or bladder.   ?

## 2022-03-05 ENCOUNTER — Telehealth: Payer: Self-pay

## 2022-03-05 NOTE — Telephone Encounter (Signed)
Transition Care Management Unsuccessful Follow-up Telephone Call ? ?Date of discharge and from where:  03/03/2022 from [facility] ? ?Attempts:  1st Attempt ? ?Reason for unsuccessful TCM follow-up call:  Left voice message ? ? ? ?

## 2022-03-06 NOTE — Telephone Encounter (Signed)
Transition Care Management Follow-up Telephone Call ?Date of discharge and from where: 03/03/2022 from [facility] ?How have you been since you were released from the hospital? Patient stated that she is feeling better and does not have any questions or concerns at this time.  ?Any questions or concerns? No ? ?Items Reviewed: ?Did the pt receive and understand the discharge instructions provided? Yes  ?Medications obtained and verified? Yes  ?Other? No  ?Any new allergies since your discharge? No  ?Dietary orders reviewed? No ?Do you have support at home? Yes  ? ?Functional Questionnaire: (I = Independent and D = Dependent) ?ADLs: I ? ?Bathing/Dressing- I ? ?Meal Prep- I ? ?Eating- I ? ?Maintaining continence- I ? ?Transferring/Ambulation- I ? ?Managing Meds- I ? ? ?Follow up appointments reviewed: ? ?PCP Hospital f/u appt confirmed? No  Patient encouraged to reach out to PCP.  ?Specialist Hospital f/u appt confirmed? No   ?Are transportation arrangements needed? No  ?If their condition worsens, is the pt aware to call PCP or go to the Emergency Dept.? Yes ?Was the patient provided with contact information for the PCP's office or ED? Yes ?Was to pt encouraged to call back with questions or concerns? Yes ? ?

## 2022-03-15 DIAGNOSIS — H5213 Myopia, bilateral: Secondary | ICD-10-CM | POA: Diagnosis not present

## 2022-03-21 NOTE — Progress Notes (Signed)
Erroneous encounter. Please disregard.

## 2022-03-22 ENCOUNTER — Ambulatory Visit: Payer: Medicaid Other | Admitting: Sports Medicine

## 2022-03-22 ENCOUNTER — Encounter: Payer: Medicaid Other | Admitting: Medical-Surgical

## 2022-03-22 ENCOUNTER — Emergency Department (HOSPITAL_BASED_OUTPATIENT_CLINIC_OR_DEPARTMENT_OTHER)
Admission: EM | Admit: 2022-03-22 | Discharge: 2022-03-22 | Discharge status: Against Medical Advice | Payer: Medicaid Other | Attending: Emergency Medicine | Admitting: Emergency Medicine

## 2022-03-22 ENCOUNTER — Other Ambulatory Visit: Payer: Self-pay

## 2022-03-22 ENCOUNTER — Encounter (HOSPITAL_BASED_OUTPATIENT_CLINIC_OR_DEPARTMENT_OTHER): Payer: Self-pay

## 2022-03-22 DIAGNOSIS — T24012A Burn of unspecified degree of left thigh, initial encounter: Secondary | ICD-10-CM | POA: Insufficient documentation

## 2022-03-22 DIAGNOSIS — Z5321 Procedure and treatment not carried out due to patient leaving prior to being seen by health care provider: Secondary | ICD-10-CM | POA: Diagnosis not present

## 2022-03-22 DIAGNOSIS — T24212A Burn of second degree of left thigh, initial encounter: Secondary | ICD-10-CM | POA: Diagnosis not present

## 2022-03-22 DIAGNOSIS — X118XXA Contact with other hot tap-water, initial encounter: Secondary | ICD-10-CM | POA: Diagnosis not present

## 2022-03-22 DIAGNOSIS — T24232A Burn of second degree of left lower leg, initial encounter: Secondary | ICD-10-CM | POA: Diagnosis not present

## 2022-03-22 DIAGNOSIS — T300 Burn of unspecified body region, unspecified degree: Secondary | ICD-10-CM

## 2022-03-22 DIAGNOSIS — T24032A Burn of unspecified degree of left lower leg, initial encounter: Secondary | ICD-10-CM | POA: Insufficient documentation
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
## 2022-03-29 DIAGNOSIS — Z419 Encounter for procedure for purposes other than remedying health state, unspecified: Secondary | ICD-10-CM | POA: Diagnosis not present

## 2022-04-03 ENCOUNTER — Ambulatory Visit: Payer: Medicaid Other | Admitting: Neurology

## 2022-04-03 ENCOUNTER — Encounter: Payer: Self-pay | Admitting: Neurology

## 2022-04-05 ENCOUNTER — Encounter: Payer: Medicaid Other | Admitting: Family Medicine

## 2022-04-05 ENCOUNTER — Encounter: Payer: Self-pay | Admitting: Family Medicine

## 2022-04-05 NOTE — Progress Notes (Signed)
Patient did not keep appointment today. She may call to reschedule.  

## 2022-04-28 DIAGNOSIS — Z419 Encounter for procedure for purposes other than remedying health state, unspecified: Secondary | ICD-10-CM | POA: Diagnosis not present

## 2022-05-21 ENCOUNTER — Encounter: Payer: Medicaid Other | Admitting: Obstetrics & Gynecology

## 2022-05-29 DIAGNOSIS — Z419 Encounter for procedure for purposes other than remedying health state, unspecified: Secondary | ICD-10-CM | POA: Diagnosis not present

## 2022-06-29 DIAGNOSIS — Z419 Encounter for procedure for purposes other than remedying health state, unspecified: Secondary | ICD-10-CM | POA: Diagnosis not present

## 2022-07-29 DIAGNOSIS — Z419 Encounter for procedure for purposes other than remedying health state, unspecified: Secondary | ICD-10-CM | POA: Diagnosis not present

## 2022-08-29 DIAGNOSIS — Z419 Encounter for procedure for purposes other than remedying health state, unspecified: Secondary | ICD-10-CM | POA: Diagnosis not present

## 2022-09-28 DIAGNOSIS — Z419 Encounter for procedure for purposes other than remedying health state, unspecified: Secondary | ICD-10-CM | POA: Diagnosis not present

## 2022-10-02 ENCOUNTER — Ambulatory Visit: Payer: Managed Care, Other (non HMO) | Admitting: Obstetrics & Gynecology

## 2022-10-29 DIAGNOSIS — Z419 Encounter for procedure for purposes other than remedying health state, unspecified: Secondary | ICD-10-CM | POA: Diagnosis not present

## 2022-11-29 DIAGNOSIS — Z419 Encounter for procedure for purposes other than remedying health state, unspecified: Secondary | ICD-10-CM | POA: Diagnosis not present

## 2022-12-27 ENCOUNTER — Encounter: Payer: Medicaid Other | Admitting: Medical-Surgical

## 2022-12-28 DIAGNOSIS — Z419 Encounter for procedure for purposes other than remedying health state, unspecified: Secondary | ICD-10-CM | POA: Diagnosis not present

## 2023-01-04 ENCOUNTER — Encounter: Payer: Medicaid Other | Admitting: Medical-Surgical

## 2023-01-04 NOTE — Progress Notes (Deleted)
   Established Patient Office Visit  Subjective   Patient ID: Leslie Tucker, female   DOB: 08-15-1996 Age: 27 y.o. MRN: [id number]   No chief complaint on file.   HPI    Objective:    There were no vitals filed for this visit.  Physical Exam   No results found for this or any previous visit (from the past 24 hour(s)).   {Labs (Optional):23779}  The ASCVD Risk score (Arnett DK, et al., 2019) failed to calculate for the following reasons:   The 2019 ASCVD risk score is only valid for ages 82 to 72   Assessment & Plan:   No problem-specific Assessment & Plan notes found for this encounter.   No follow-ups on file.  ___________________________________________ Clearnce Sorrel, DNP, APRN, FNP-BC [facility] [place]

## 2023-01-28 DIAGNOSIS — Z419 Encounter for procedure for purposes other than remedying health state, unspecified: Secondary | ICD-10-CM | POA: Diagnosis not present

## 2023-02-27 DIAGNOSIS — Z419 Encounter for procedure for purposes other than remedying health state, unspecified: Secondary | ICD-10-CM | POA: Diagnosis not present

## 2023-03-30 DIAGNOSIS — Z419 Encounter for procedure for purposes other than remedying health state, unspecified: Secondary | ICD-10-CM | POA: Diagnosis not present

## 2023-04-29 DIAGNOSIS — Z419 Encounter for procedure for purposes other than remedying health state, unspecified: Secondary | ICD-10-CM | POA: Diagnosis not present

## 2023-05-30 DIAGNOSIS — Z419 Encounter for procedure for purposes other than remedying health state, unspecified: Secondary | ICD-10-CM | POA: Diagnosis not present

## 2023-06-17 IMAGING — US US ABDOMEN COMPLETE
1 series · 15 of 25 positions shown · non-contrast
Comparison: Abdominal MRI 10/28/2021, CT 10/27/2021

CLINICAL DATA: Acute pancreatitis.

EXAM:
ABDOMEN ULTRASOUND COMPLETE

[Series 1: us abdomen complete mc & wl · 15 of 105 slices shown]
[im 1/105]
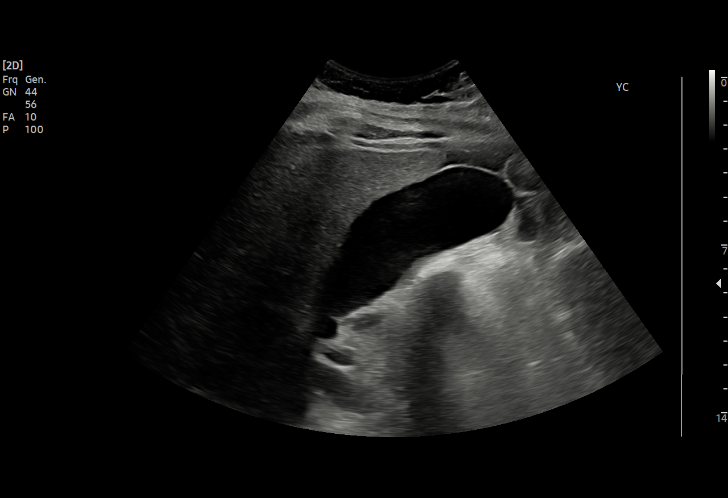
[im 9/105]
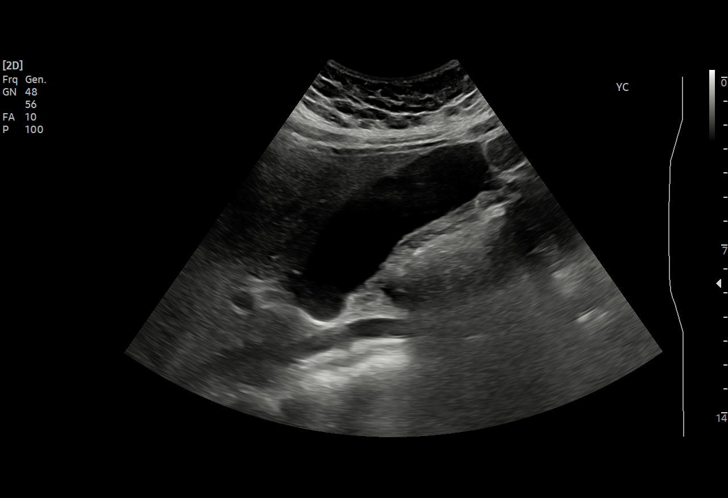
[im 18/105]
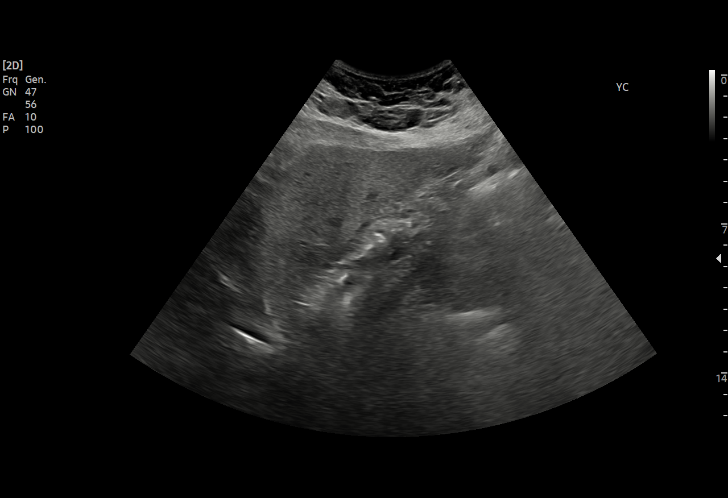
[im 22/105]
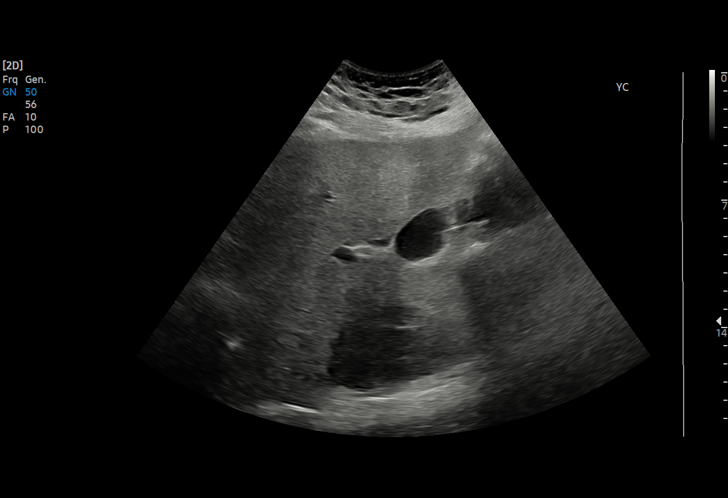
[im 31/105]
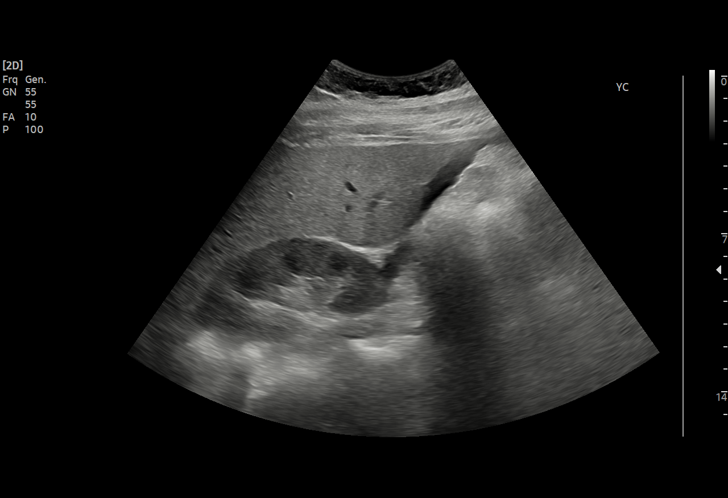
[im 40/105]
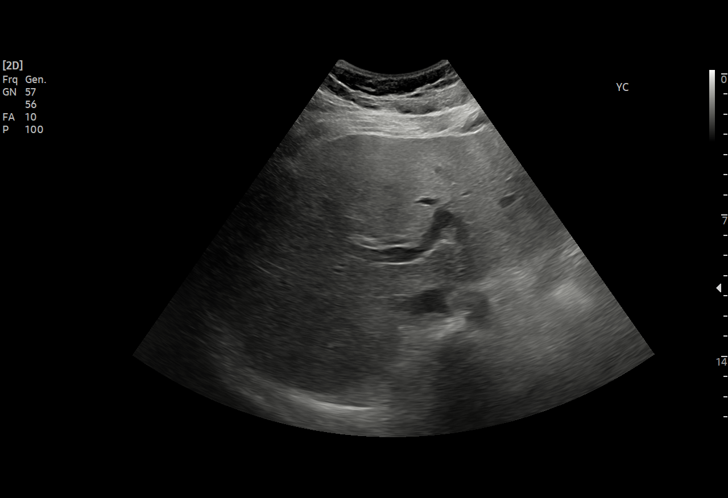
[im 44/105]
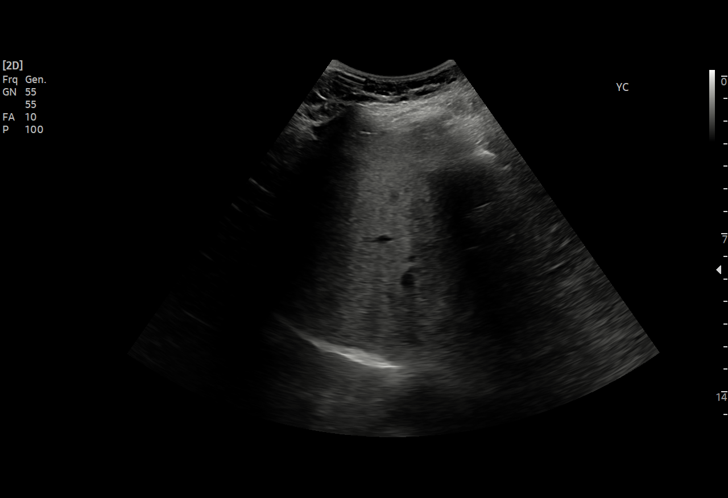
[im 53/105]
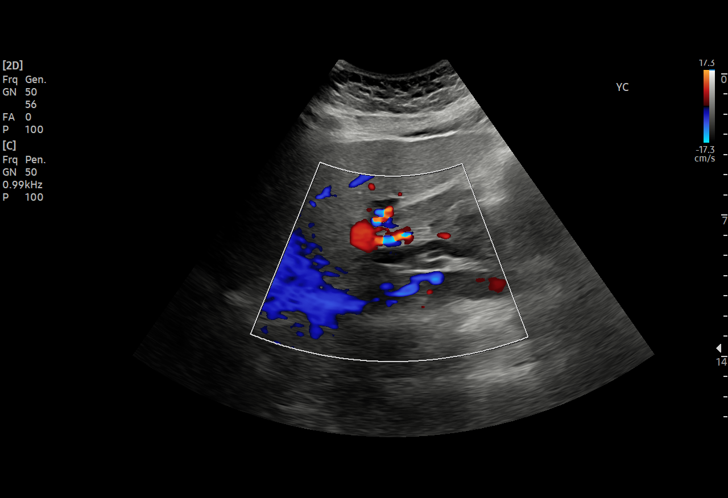
[im 61/105]
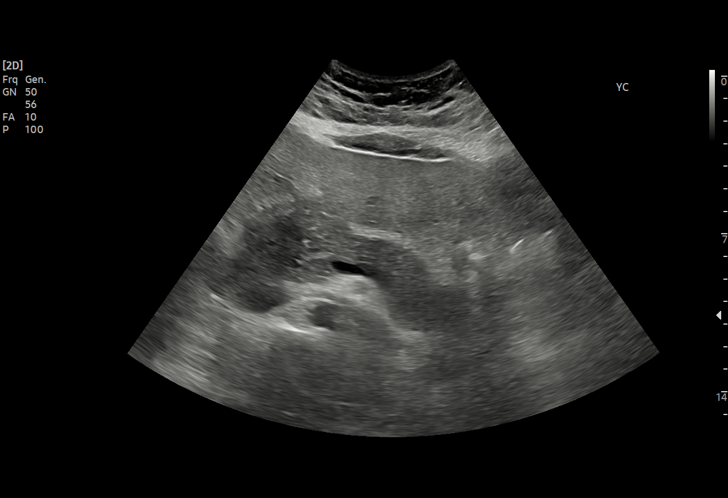
[im 66/105]
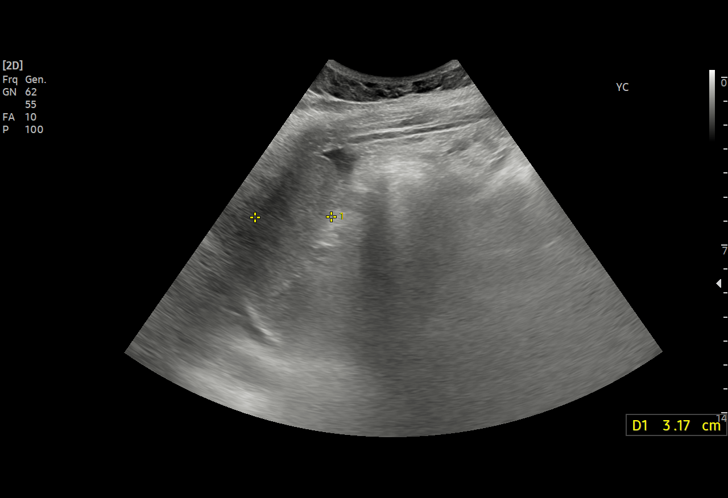
[im 74/105]
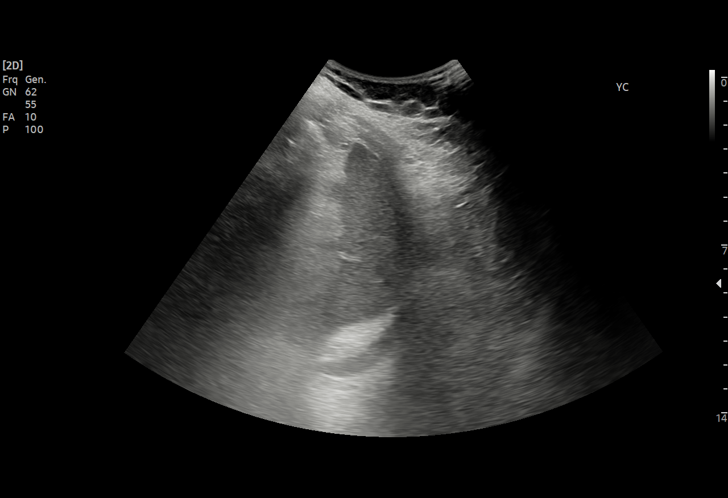
[im 83/105]
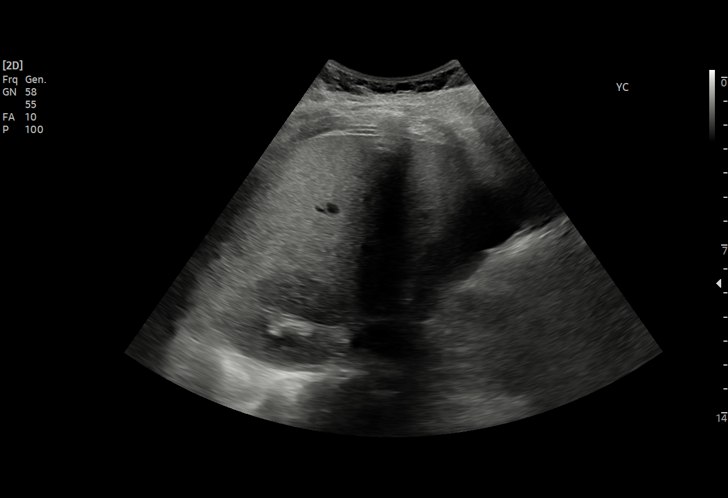
[im 87/105]
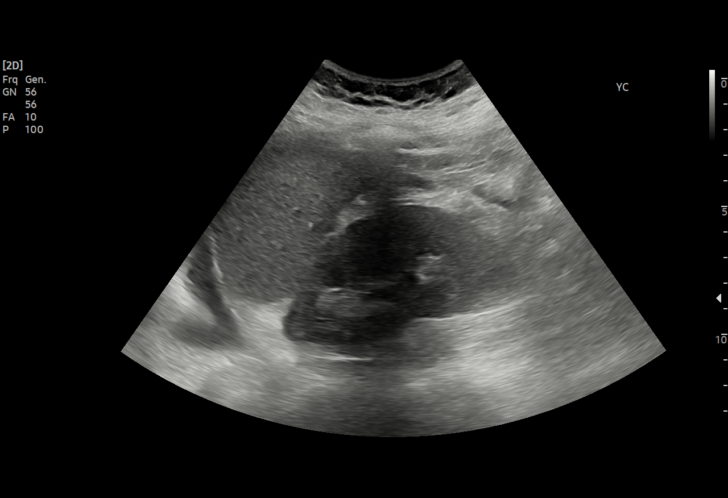
[im 96/105]
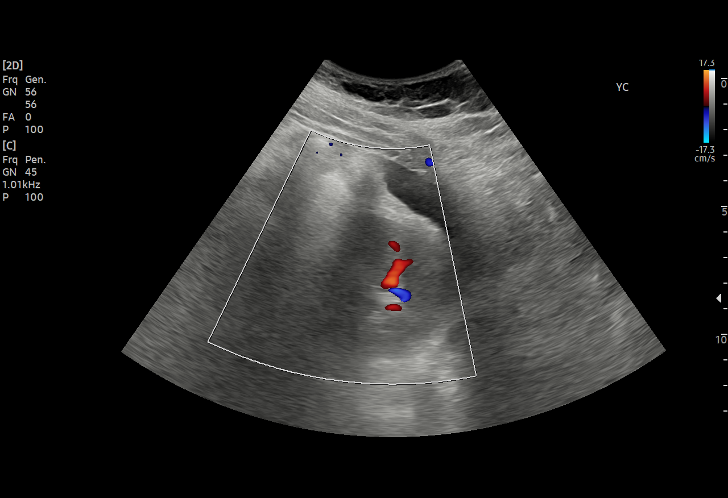
[im 105/105]
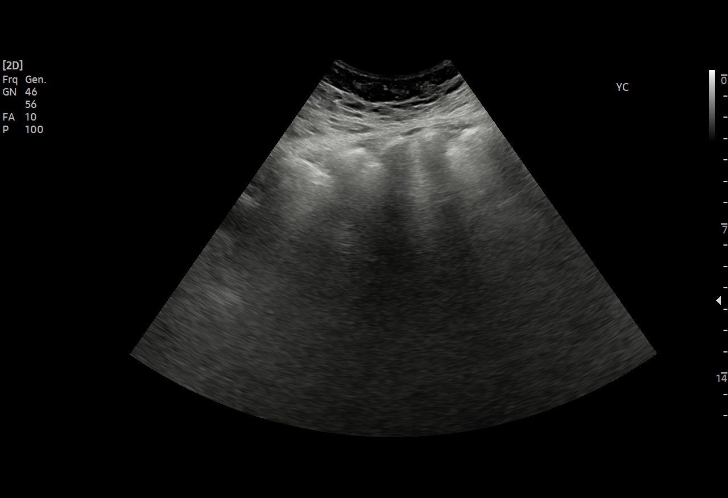

[15 of 25 positions shown; findings below may reference images not displayed]

FINDINGS: Gallbladder: Physiologically distended. No gallstones or wall
thickening visualized. No sonographic Murphy sign noted by
sonographer.

Common bile duct: Diameter: 10 mm, dilated.

Liver: Diffusely increased parenchymal echogenicity. No focal
lesion. No capsular nodularity. Mild central intrahepatic biliary
ductal dilatation. Portal vein is patent on color Doppler imaging
with normal direction of blood flow towards the liver.

IVC: No abnormality visualized.

Pancreas: Proximal pancreas including the head and neck are not well
visualized due to overlying bowel gas. Distal body and tail are
unremarkable.

Spleen: Size and appearance within normal limits.

Right Kidney: Length: 9.4 cm. Normal parenchymal echogenicity. No
hydronephrosis. No visualized stone or focal lesion.

Left Kidney: Length: 9.6 cm. Normal parenchymal echogenicity. No
hydronephrosis. No visualized stone or focal lesion.

Abdominal aorta: No aneurysm visualized.  Distal aorta is obscured.

Other findings: Small amount of upper abdominal ascites in the right
and left upper quadrants.
IMPRESSION: 1. Biliary dilatation with common bile duct measuring 10 mm. This is
new from recent prior imaging, proximal common bile duct was 7 mm on
recent MRCP. Etiology is uncertain.
2. Hepatic steatosis.
3. Head and neck of the pancreas are obscured by bowel gas, distal
body and tail are unremarkable in sonographic appearance.
4. Small amount of ascites in the right left upper quadrant

## 2023-06-30 DIAGNOSIS — Z419 Encounter for procedure for purposes other than remedying health state, unspecified: Secondary | ICD-10-CM | POA: Diagnosis not present

## 2023-07-30 DIAGNOSIS — Z419 Encounter for procedure for purposes other than remedying health state, unspecified: Secondary | ICD-10-CM | POA: Diagnosis not present

## 2023-08-30 DIAGNOSIS — Z419 Encounter for procedure for purposes other than remedying health state, unspecified: Secondary | ICD-10-CM | POA: Diagnosis not present

## 2023-09-29 DIAGNOSIS — Z419 Encounter for procedure for purposes other than remedying health state, unspecified: Secondary | ICD-10-CM | POA: Diagnosis not present

## 2023-10-30 DIAGNOSIS — Z419 Encounter for procedure for purposes other than remedying health state, unspecified: Secondary | ICD-10-CM | POA: Diagnosis not present

## 2023-11-30 DIAGNOSIS — Z419 Encounter for procedure for purposes other than remedying health state, unspecified: Secondary | ICD-10-CM | POA: Diagnosis not present

## 2023-12-28 DIAGNOSIS — Z419 Encounter for procedure for purposes other than remedying health state, unspecified: Secondary | ICD-10-CM | POA: Diagnosis not present

## 2024-02-08 DIAGNOSIS — Z419 Encounter for procedure for purposes other than remedying health state, unspecified: Secondary | ICD-10-CM | POA: Diagnosis not present

## 2024-03-09 DIAGNOSIS — Z419 Encounter for procedure for purposes other than remedying health state, unspecified: Secondary | ICD-10-CM | POA: Diagnosis not present

## 2024-04-09 DIAGNOSIS — Z419 Encounter for procedure for purposes other than remedying health state, unspecified: Secondary | ICD-10-CM | POA: Diagnosis not present

## 2024-05-09 DIAGNOSIS — Z419 Encounter for procedure for purposes other than remedying health state, unspecified: Secondary | ICD-10-CM | POA: Diagnosis not present

## 2024-06-09 DIAGNOSIS — Z419 Encounter for procedure for purposes other than remedying health state, unspecified: Secondary | ICD-10-CM | POA: Diagnosis not present

## 2024-06-30 ENCOUNTER — Encounter: Payer: Self-pay | Admitting: Sports Medicine

## 2024-07-10 DIAGNOSIS — Z419 Encounter for procedure for purposes other than remedying health state, unspecified: Secondary | ICD-10-CM | POA: Diagnosis not present
